# Patient Record
Sex: Male | Born: 1940 | Race: White | Hispanic: No | Marital: Married | Smoking: Former smoker
Health system: Southern US, Community
[De-identification: ages and names within clinical notes are randomized; demographics above are authoritative.]

## PROBLEM LIST (undated history)

## (undated) DIAGNOSIS — J449 Chronic obstructive pulmonary disease, unspecified: Secondary | ICD-10-CM

## (undated) DIAGNOSIS — I10 Essential (primary) hypertension: Secondary | ICD-10-CM

---

## 2005-05-23 ENCOUNTER — Emergency Department: Payer: Self-pay | Admitting: Emergency Medicine

## 2006-08-27 ENCOUNTER — Emergency Department: Payer: Self-pay | Admitting: Emergency Medicine

## 2007-07-05 ENCOUNTER — Emergency Department: Payer: Self-pay | Admitting: Emergency Medicine

## 2007-07-05 ENCOUNTER — Other Ambulatory Visit: Payer: Self-pay

## 2008-02-22 ENCOUNTER — Emergency Department: Payer: Self-pay | Admitting: Emergency Medicine

## 2008-02-22 ENCOUNTER — Other Ambulatory Visit: Payer: Self-pay

## 2010-06-09 ENCOUNTER — Observation Stay: Payer: Self-pay | Admitting: *Deleted

## 2016-02-18 ENCOUNTER — Emergency Department: Payer: Self-pay

## 2016-02-18 ENCOUNTER — Emergency Department
Admission: EM | Admit: 2016-02-18 | Discharge: 2016-02-19 | Disposition: A | Discharge status: Home / Self Care | Payer: Self-pay | Attending: Emergency Medicine | Admitting: Emergency Medicine

## 2016-02-18 ENCOUNTER — Encounter: Payer: Self-pay | Admitting: Emergency Medicine

## 2016-02-18 DIAGNOSIS — I10 Essential (primary) hypertension: Secondary | ICD-10-CM | POA: Insufficient documentation

## 2016-02-18 DIAGNOSIS — R0602 Shortness of breath: Secondary | ICD-10-CM | POA: Insufficient documentation

## 2016-02-18 DIAGNOSIS — R112 Nausea with vomiting, unspecified: Secondary | ICD-10-CM

## 2016-02-18 DIAGNOSIS — Z87891 Personal history of nicotine dependence: Secondary | ICD-10-CM | POA: Insufficient documentation

## 2016-02-18 DIAGNOSIS — J441 Chronic obstructive pulmonary disease with (acute) exacerbation: Secondary | ICD-10-CM

## 2016-02-18 DIAGNOSIS — R05 Cough: Secondary | ICD-10-CM

## 2016-02-18 DIAGNOSIS — R1084 Generalized abdominal pain: Secondary | ICD-10-CM

## 2016-02-18 DIAGNOSIS — R058 Other specified cough: Secondary | ICD-10-CM

## 2016-02-18 HISTORY — DX: Chronic obstructive pulmonary disease, unspecified: J44.9

## 2016-02-18 HISTORY — DX: Essential (primary) hypertension: I10

## 2016-02-18 LAB — MAGNESIUM: MAGNESIUM: 1.8 mg/dL (ref 1.7–2.4)

## 2016-02-18 LAB — COMPREHENSIVE METABOLIC PANEL
ALT: 24 U/L (ref 17–63)
ANION GAP: 10 (ref 5–15)
AST: 32 U/L (ref 15–41)
Albumin: 4.6 g/dL (ref 3.5–5.0)
Alkaline Phosphatase: 80 U/L (ref 38–126)
BUN: 24 mg/dL — ABNORMAL HIGH (ref 6–20)
CHLORIDE: 102 mmol/L (ref 101–111)
CO2: 28 mmol/L (ref 22–32)
CREATININE: 1.31 mg/dL — AB (ref 0.61–1.24)
Calcium: 9.5 mg/dL (ref 8.9–10.3)
GFR calc non Af Amer: 52 mL/min — ABNORMAL LOW (ref >60)
Glucose, Bld: 128 mg/dL — ABNORMAL HIGH (ref 65–99)
Potassium: 4.3 mmol/L (ref 3.5–5.1)
SODIUM: 140 mmol/L (ref 135–145)
Total Bilirubin: 0.8 mg/dL (ref 0.3–1.2)
Total Protein: 7.4 g/dL (ref 6.5–8.1)

## 2016-02-18 LAB — CBC WITH DIFFERENTIAL/PLATELET
BASOS PCT: 0 %
Basophils Absolute: 0 10*3/uL (ref 0–0.1)
EOS ABS: 0 10*3/uL (ref 0–0.7)
EOS PCT: 0 %
HCT: 43 % (ref 40.0–52.0)
Hemoglobin: 14.9 g/dL (ref 13.0–18.0)
LYMPHS ABS: 0.4 10*3/uL — AB (ref 1.0–3.6)
Lymphocytes Relative: 5 %
MCH: 30.4 pg (ref 26.0–34.0)
MCHC: 34.6 g/dL (ref 32.0–36.0)
MCV: 87.7 fL (ref 80.0–100.0)
Monocytes Absolute: 0 10*3/uL — ABNORMAL LOW (ref 0.2–1.0)
Monocytes Relative: 0 %
Neutro Abs: 8 10*3/uL — ABNORMAL HIGH (ref 1.4–6.5)
Neutrophils Relative %: 95 %
PLATELETS: 223 10*3/uL (ref 150–440)
RBC: 4.9 MIL/uL (ref 4.40–5.90)
RDW: 13.9 % (ref 11.5–14.5)
WBC: 8.5 10*3/uL (ref 3.8–10.6)

## 2016-02-18 LAB — TROPONIN I

## 2016-02-18 LAB — BRAIN NATRIURETIC PEPTIDE: B Natriuretic Peptide: 12 pg/mL (ref 0.0–100.0)

## 2016-02-18 LAB — LIPASE, BLOOD: Lipase: 33 U/L (ref 11–51)

## 2016-02-18 MED ORDER — ONDANSETRON HCL 4 MG/2ML IJ SOLN
4.0000 mg | INTRAMUSCULAR | Status: AC
  Administered 2016-02-18: 4 mg via INTRAVENOUS
  Filled 2016-02-18: qty 2

## 2016-02-18 MED ORDER — IPRATROPIUM-ALBUTEROL 0.5-2.5 (3) MG/3ML IN SOLN
3.0000 mL | Freq: Once | RESPIRATORY_TRACT | Status: DC
  Filled 2016-02-18: qty 3

## 2016-02-18 NOTE — ED Provider Notes (Signed)
[facility] Emergency Department Provider Note  ____________________________________________  Time seen: Approximately 11:02 PM  I have reviewed the triage vital signs and the nursing notes.   HISTORY  Chief Complaint Cough and Emesis    HPI Ross Montgomery is a 75 y.o. male who reports a medical history of COPD without a home oxygen requirement and hypertension who presents by EMS for evaluation of a severe cough productive of clear sputum, shortness of breath, and now vomiting.  He says that he was in his normal state of health yesterday but all day today he has been coughing heavily.  He gets into spasms of coughing and is producing a large amount of clear sputum.  By the evening he was coughing enough that he started vomiting as well and came in by EMS with an emesis bag with [place] in the bottom of it but no evidence of hematemesis.  He says that the muscles of his stomach hurt but he does not have abdominal pain per se.  He denies fever/chills, chest pain, dysuria.  He ascribes the cough is severe and the nausea is severe and nothing is making either one of them better.  The paramedics report that when the first responder arrived he was satting 93% on room air.  He was wheezing and was given about half of a DuoNeb prior to arrival to the emergency department.  The wheezing has since resolved.  Currently he denies rest of breath, just the persistent coughing although that seems a little bit better at this time.  He is sitting up and leaning forward but not in a tripod position for breathing, rather because he seems very nauseated and is holding his emesis bag.   Past Medical History  Diagnosis Date  . COPD (chronic obstructive pulmonary disease) (HCC)   . Hypertension     There are no active problems to display for this patient.   History reviewed. No pertinent past surgical history.  Current Outpatient Rx  Name  Route  Sig  Dispense  Refill  . albuterol  (PROVENTIL HFA;VENTOLIN HFA) 108 (90 Base) MCG/ACT inhaler      Inhale 4-6 puffs by mouth every 4 hours as needed for wheezing, cough, and/or shortness of breath   1 Inhaler   1   . ondansetron (ZOFRAN) 4 MG tablet      Take 1-2 tabs by mouth every 8 hours as needed for nausea/vomiting   30 tablet   0   . predniSONE (DELTASONE) 20 MG tablet   Oral   Take 2 tablets (40 mg total) by mouth daily.   10 tablet   0     Allergies Review of patient's allergies indicates no known allergies.  History reviewed. No pertinent family history.  Social History Social History  Substance Use Topics  . Smoking status: Former Smoker    Types: Cigarettes  . Smokeless tobacco: None  . Alcohol Use: No    Review of Systems Constitutional: No fever/chills Eyes: No visual changes. ENT: No sore throat. Cardiovascular: Denies chest pain. Respiratory: +shortness of breath and severe productive cough Gastrointestinal: Abdominal pain with multiple episodes of emesis, possibly posttussive Genitourinary: Negative for dysuria. Musculoskeletal: Negative for back pain. Skin: Negative for rash. Neurological: Negative for headaches, focal weakness or numbness.  10-point ROS otherwise negative.  ____________________________________________   PHYSICAL EXAM:  ED Triage Vitals  Enc Vitals Group     BP 02/18/16 2304 152/64 mmHg     Pulse Rate 02/18/16 2304 110  Resp --      Temp --      Temp src --      SpO2 02/18/16 2304 95 %     Weight 02/18/16 2304 157 lb 3.2 oz (71.305 kg)     Height 02/18/16 2304 5\' 8"  (1.727 m)     Head Cir --      Peak Flow --      Pain Score --      Pain Loc --      Pain Edu? --      Excl. in GC? --     Constitutional: Alert and oriented. Ill-appearing but non-toxic Eyes: Conjunctivae are normal. PERRL. EOMI. Head: Atraumatic. Nose: No congestion/rhinnorhea. Mouth/Throat: Mucous membranes are moist.  Oropharynx non-erythematous. Neck: No stridor.  No  meningeal signs.   Cardiovascular: Tachycardia, regular rhythm. Good peripheral circulation. Grossly normal heart sounds.   Respiratory: Normal respiratory effort.  No retractions. Lungs CTAB. Gastrointestinal: Soft and nontender. No distention.  Musculoskeletal: No lower extremity tenderness nor edema. No gross deformities of extremities. Neurologic:  Normal speech and language. No gross focal neurologic deficits are appreciated.  Skin:  Skin is warm, dry and intact. No rash noted. Psychiatric: Mood and affect are normal. Speech and behavior are normal.  ____________________________________________   LABS (all labs ordered are listed, but only abnormal results are displayed)  Labs Reviewed  CBC WITH DIFFERENTIAL/PLATELET - Abnormal; Notable for the following:    Neutro Abs 8.0 (*)    Lymphs Abs 0.4 (*)    Monocytes Absolute 0.0 (*)    All other components within normal limits  COMPREHENSIVE METABOLIC PANEL - Abnormal; Notable for the following:    Glucose, Bld 128 (*)    BUN 24 (*)    Creatinine, Ser 1.31 (*)    GFR calc non Af Amer 52 (*)    All other components within normal limits  LIPASE, BLOOD  TROPONIN I  MAGNESIUM  BRAIN NATRIURETIC PEPTIDE   ____________________________________________  EKG  ED ECG REPORT I, Worley Radermacher, the attending physician, personally viewed and interpreted this ECG.  Date: 02/19/2016 EKG Time: 01:11 Rate: 95 Rhythm: normal sinus rhythm QRS Axis: normal Intervals: normal ST/T Wave abnormalities: normal Conduction Disturbances: none Narrative Interpretation: unremarkable  ____________________________________________  RADIOLOGY   Ct Abdomen Pelvis W Contrast  02/19/2016  CLINICAL DATA:  Abdominal pain, vomiting, and cough. EXAM: CT ABDOMEN AND PELVIS WITH CONTRAST TECHNIQUE: Multidetector CT imaging of the abdomen and pelvis was performed using the standard protocol following bolus administration of intravenous contrast. CONTRAST:   100mL ISOVUE-300 IOPAMIDOL (ISOVUE-300) INJECTION 61% COMPARISON:  None. FINDINGS: The lung bases are clear. Small esophageal hiatal hernia. Fluid in the lower esophagus may indicate reflux or dysmotility. Mild diffuse fatty infiltration of the liver. Large stone in the gallbladder. No gallbladder wall thickening. No bile duct dilatation. Fatty infiltration of the pancreas. The spleen, adrenal glands, kidneys, inferior vena cava, and retroperitoneal lymph nodes are unremarkable. Diffuse calcification of the abdominal aorta without aneurysm. Stomach, small bowel, and colon are not abnormally distended. Scattered stool in the colon. No free air or free fluid in the abdomen. Small umbilical hernia containing fat. Pelvis: The appendix is normal. Prostate gland is enlarged, measuring 4.1 cm diameter. Bladder wall is not thickened. Small bilateral inguinal hernias containing fat. No free or loculated pelvic fluid collections. Diverticulosis of the sigmoid colon without evidence of diverticulitis. There is a focal inflamed fat lobule adjacent to the distal descending colon consistent with epiploic appendagitis. Degenerative changes  in the spine. No destructive bone lesions. Soft tissue nodule in the subcutaneous fat over the posterior lumbar spine consistent with sebaceous cyst. IMPRESSION: Small esophageal hiatal hernia with fluid in the lower esophagus suggesting reflux or dysmotility. No bowel obstruction or inflammation. Focal epiploic appendagitis in the left lower quadrant. Prostate gland is enlarged. Fatty infiltration of the liver. Small fat containing hernias at the umbilicus and both inguinal regions. Cholelithiasis. Aortic atherosclerosis. Electronically Signed   By: Burman Nieves M.D.   On: 02/19/2016 02:00   Dg Chest Portable 1 View  02/18/2016  CLINICAL DATA:  Shortness of breath, cough, and vomiting. EXAM: PORTABLE CHEST 1 VIEW COMPARISON:  06/09/2010 FINDINGS: Emphysematous changes in the lungs. No  focal airspace disease or consolidation. No blunting of costophrenic angles. No pneumothorax. Normal heart size and pulmonary vascularity. IMPRESSION: No active disease.  Emphysematous changes in the lungs. Electronically Signed   By: Burman Nieves M.D.   On: 02/18/2016 23:29    ____________________________________________   PROCEDURES  Procedure(s) performed:   Procedures   ____________________________________________   INITIAL IMPRESSION / ASSESSMENT AND PLAN / ED COURSE  Pertinent labs & imaging results that were available during my care of the patient were reviewed by me and considered in my medical decision making (see chart for details).  Currently the patient's lungs sound clear and they are open.  I will give him another DuoNeb given his history of COPD and the persistent coughing and it may also help with bronchospasm.  I am giving Zofran for the nausea and vomiting.  I will wait for IV fluids until we see the chest x-ray because of the possibility he may have some pulmonary edema which is the sputum he is producing, but I will reassess carefully once his lab results and imaging come back.  ----------------------------------------- 1:18 AM on 02/19/2016 -----------------------------------------  Labs and chest x-ray are unremarkable but the patient continues to vomit in spite of 2 rounds of Zofran 4 mg IV.  I will give Haldol 1 mg IV for the intractable vomiting.  He is also continued to complain of some abdominal pain although his main issue is the vomiting.  I will evaluate with a CT of the abdomen and pelvis with IV contrast only as he cannot currently tolerate by mouth contrast.   ----------------------------------------- 3:21 AM on 02/19/2016 -----------------------------------------  The patient is now resting comfortably.  He awoke to light touch and light voice.  He states that his pain has resolved as has his nausea.  I will watch him for a little bit longer to  make sure the nausea does not return.  ----------------------------------------- 4:16 AM on 02/19/2016 -----------------------------------------  Patient continues to rest comfortably, has no complaints at this time.  Overall reassuring workup, suspect mild COPD exacerbation led to posttussive emesis that became uncontrollable, but eventually resolved with medications.  Treating for COPD exacerbation and nausea/vomiting with Rx.    I gave my usual and customary return precautions.      ____________________________________________  FINAL CLINICAL IMPRESSION(S) / ED DIAGNOSES  Final diagnoses:  Non-intractable vomiting with nausea, vomiting of unspecified type  Generalized abdominal pain  Cough productive of clear sputum  COPD with acute exacerbation (HCC)     MEDICATIONS GIVEN DURING THIS VISIT:  Medications  ipratropium-albuterol (DUONEB) 0.5-2.5 (3) MG/3ML nebulizer solution 3 mL (0 mLs Nebulization Hold 02/18/16 2347)  ondansetron (ZOFRAN) injection 4 mg (4 mg Intravenous Given 02/18/16 2347)  ondansetron (ZOFRAN) injection 4 mg (4 mg Intravenous Given 02/19/16 0042)  morphine 4 MG/ML injection 4 mg (4 mg Intravenous Given 02/19/16 0042)  haloperidol lactate (HALDOL) injection 1 mg (1 mg Intravenous Given 02/19/16 0128)  iopamidol (ISOVUE-300) 61 % injection 100 mL (100 mLs Intravenous Contrast Given 02/19/16 0141)     NEW OUTPATIENT MEDICATIONS STARTED DURING THIS VISIT:  New Prescriptions   ALBUTEROL (PROVENTIL HFA;VENTOLIN HFA) 108 (90 BASE) MCG/ACT INHALER    Inhale 4-6 puffs by mouth every 4 hours as needed for wheezing, cough, and/or shortness of breath   ONDANSETRON (ZOFRAN) 4 MG TABLET    Take 1-2 tabs by mouth every 8 hours as needed for nausea/vomiting   PREDNISONE (DELTASONE) 20 MG TABLET    Take 2 tablets (40 mg total) by mouth daily.      Note:  This document was prepared using Dragon voice recognition software and may include unintentional dictation  errors.   Loleta Rose, MD 02/19/16 [phone]

## 2016-02-18 NOTE — ED Notes (Signed)
Pt to ED via [facility] c/o cough. Per EMS pt c/o cough w/ SOB sating at 90% on RA, EMS reports administering 1/2 duoneb. EMS also reports cough induced vomiting. Pt present alert and oriented, presently coughing and vomiting.

## 2016-02-19 ENCOUNTER — Emergency Department: Payer: Self-pay

## 2016-02-19 MED ORDER — IOPAMIDOL (ISOVUE-300) INJECTION 61%
100.0000 mL | Freq: Once | INTRAVENOUS | Status: AC | PRN
  Administered 2016-02-19: 100 mL via INTRAVENOUS

## 2016-02-19 MED ORDER — ALBUTEROL SULFATE HFA 108 (90 BASE) MCG/ACT IN AERS: INHALATION_SPRAY | RESPIRATORY_TRACT | Status: AC

## 2016-02-19 MED ORDER — MORPHINE SULFATE (PF) 4 MG/ML IV SOLN
4.0000 mg | Freq: Once | INTRAVENOUS | Status: AC
  Administered 2016-02-19: 4 mg via INTRAVENOUS

## 2016-02-19 MED ORDER — ONDANSETRON HCL 4 MG PO TABS: ORAL_TABLET | ORAL | Status: DC

## 2016-02-19 MED ORDER — ONDANSETRON HCL 4 MG/2ML IJ SOLN
4.0000 mg | INTRAMUSCULAR | Status: AC
  Administered 2016-02-19: 4 mg via INTRAVENOUS
  Filled 2016-02-19: qty 2

## 2016-02-19 MED ORDER — MORPHINE SULFATE (PF) 4 MG/ML IV SOLN
INTRAVENOUS | Status: AC
  Administered 2016-02-19: 4 mg via INTRAVENOUS
  Filled 2016-02-19: qty 1

## 2016-02-19 MED ORDER — HALOPERIDOL LACTATE 5 MG/ML IJ SOLN
1.0000 mg | Freq: Once | INTRAMUSCULAR | Status: AC
  Administered 2016-02-19: 1 mg via INTRAVENOUS
  Filled 2016-02-19: qty 1

## 2016-02-19 MED ORDER — PREDNISONE 20 MG PO TABS: 40.0000 mg | ORAL_TABLET | Freq: Every day | ORAL | Status: DC

## 2016-02-19 NOTE — ED Notes (Signed)
Pt returned from CT laying on stomach with no further vomiting and appears to be in less pain than since he arrived in er - O2 Sat 88% on room air - O2 2L started and O2 sat increased to 94% - encouraged pt to take inhalations through nose

## 2016-02-19 NOTE — ED Notes (Signed)
Pt refused to go to CT stating that he could not lay flat - Dr York CeriseForbach informed - pt then informed that the test was necessary and that we would take him after the Haldol had time to work

## 2016-02-19 NOTE — ED Notes (Signed)
After discussing with pt the importance of abd CT he has agreed to go to CT and attempt to lay flat for exam

## 2016-02-19 NOTE — ED Notes (Signed)
Unable to obtain ekg because pt refuses to lay back in bed and is laying forward bent over - he has had medication for nausea (no further vomiting noted just burping) - he has had medication for abd pain that he states has decreased

## 2016-02-19 NOTE — Discharge Instructions (Signed)
As we discussed, we think he may be having a mild COPD exacerbation which is causing your wheezing and cough.  This led to some vomiting which was difficult to get under control.  However, once it stopped, your symptoms improved.  Your chest x-ray, abdominal CT, and all your lab work were reassuring.  Please take the prescribed medications as recommended and follow up with your regular doctor at the next available opportunity.    Return to the emergency department if you develop new or worsening symptoms that concern you.   Nausea and Vomiting Nausea is a sick feeling that often comes before throwing up (vomiting). Vomiting is a reflex where stomach contents come out of your mouth. Vomiting can cause severe loss of body fluids (dehydration). Children and elderly adults can become dehydrated quickly, especially if they also have diarrhea. Nausea and vomiting are symptoms of a condition or disease. It is important to find the cause of your symptoms. CAUSES   Direct irritation of the stomach lining. This irritation can result from increased acid production (gastroesophageal reflux disease), infection, food poisoning, taking certain medicines (such as nonsteroidal anti-inflammatory drugs), alcohol use, or tobacco use.  Signals from the brain.These signals could be caused by a headache, heat exposure, an inner ear disturbance, increased pressure in the brain from injury, infection, a tumor, or a concussion, pain, emotional stimulus, or metabolic problems.  An obstruction in the gastrointestinal tract (bowel obstruction).  Illnesses such as diabetes, hepatitis, gallbladder problems, appendicitis, kidney problems, cancer, sepsis, atypical symptoms of a heart attack, or eating disorders.  Medical treatments such as chemotherapy and radiation.  Receiving medicine that makes you sleep (general anesthetic) during surgery. DIAGNOSIS Your caregiver may ask for tests to be done if the problems do not improve  after a few days. Tests may also be done if symptoms are severe or if the reason for the nausea and vomiting is not clear. Tests may include:  Urine tests.  Blood tests.  Stool tests.  Cultures (to look for evidence of infection).  X-rays or other imaging studies. Test results can help your caregiver make decisions about treatment or the need for additional tests. TREATMENT You need to stay well hydrated. Drink frequently but in small amounts.You may wish to drink water, sports drinks, clear broth, or eat frozen ice pops or gelatin dessert to help stay hydrated.When you eat, eating slowly may help prevent nausea.There are also some antinausea medicines that may help prevent nausea. HOME CARE INSTRUCTIONS   Take all medicine as directed by your caregiver.  If you do not have an appetite, do not force yourself to eat. However, you must continue to drink fluids.  If you have an appetite, eat a normal diet unless your caregiver tells you differently.  Eat a variety of complex carbohydrates (rice, wheat, potatoes, bread), lean meats, yogurt, fruits, and vegetables.  Avoid high-fat foods because they are more difficult to digest.  Drink enough water and fluids to keep your urine clear or pale yellow.  If you are dehydrated, ask your caregiver for specific rehydration instructions. Signs of dehydration may include:  Severe thirst.  Dry lips and mouth.  Dizziness.  Dark urine.  Decreasing urine frequency and amount.  Confusion.  Rapid breathing or pulse. SEEK IMMEDIATE MEDICAL CARE IF:   You have blood or brown flecks (like coffee grounds) in your vomit.  You have black or bloody stools.  You have a severe headache or stiff neck.  You are confused.  You have  severe abdominal pain.  You have chest pain or trouble breathing.  You do not urinate at least once every 8 hours.  You develop cold or clammy skin.  You continue to vomit for longer than 24 to 48  hours.  You have a fever. MAKE SURE YOU:   Understand these instructions.  Will watch your condition.  Will get help right away if you are not doing well or get worse.   This information is not intended to replace advice given to you by your health care provider. Make sure you discuss any questions you have with your health care provider.   Document Released: 07/26/2005 Document Revised: 10/18/2011 Document Reviewed: 12/23/2010 Elsevier Interactive Patient Education 2016 [employer] Inc[employer]  Chronic Obstructive Pulmonary Disease Exacerbation Chronic obstructive pulmonary disease (COPD) is a common lung problem. In COPD, the flow of air from the lungs is limited. COPD exacerbations are times that breathing gets worse and you need extra treatment. Without treatment they can be life threatening. If they happen often, your lungs can become more damaged. If your COPD gets worse, your doctor may treat you with:  Medicines.  Oxygen.  Different ways to clear your airway, such as using a mask. HOME CARE  Do not smoke.  Avoid tobacco smoke and other things that bother your lungs.  If given, take your antibiotic medicine as told. Finish the medicine even if you start to feel better.  Only take medicines as told by your doctor.  Drink enough fluids to keep your pee (urine) clear or pale yellow (unless your doctor has told you not to).  Use a cool mist machine (vaporizer).  If you use oxygen or a machine that turns liquid medicine into a mist (nebulizer), continue to use them as told.  Keep up with shots (vaccinations) as told by your doctor.  Exercise regularly.  Eat healthy foods.  Keep all doctor visits as told. GET HELP RIGHT AWAY IF:  You are very short of breath and it gets worse.  You have trouble talking.  You have bad chest pain.  You have blood in your spit (sputum).  You have a fever.  You keep throwing up (vomiting).  You feel weak, or you pass out  (faint).  You feel confused.  You keep getting worse. MAKE SURE YOU:  Understand these instructions.  Will watch your condition.  Will get help right away if you are not doing well or get worse.   This information is not intended to replace advice given to you by your health care provider. Make sure you discuss any questions you have with your health care provider.   Document Released: 07/15/2011 Document Revised: 08/16/2014 Document Reviewed: 03/30/2013 Elsevier Interactive Patient Education [employer]

## 2016-12-24 ENCOUNTER — Emergency Department
Admission: EM | Admit: 2016-12-24 | Discharge: 2016-12-24 | Disposition: A | Discharge status: Home / Self Care | Payer: Medicare Other | Attending: Emergency Medicine | Admitting: Emergency Medicine

## 2016-12-24 ENCOUNTER — Encounter: Payer: Self-pay | Admitting: Emergency Medicine

## 2016-12-24 DIAGNOSIS — I10 Essential (primary) hypertension: Secondary | ICD-10-CM | POA: Insufficient documentation

## 2016-12-24 DIAGNOSIS — L0291 Cutaneous abscess, unspecified: Secondary | ICD-10-CM

## 2016-12-24 DIAGNOSIS — L0201 Cutaneous abscess of face: Secondary | ICD-10-CM | POA: Insufficient documentation

## 2016-12-24 DIAGNOSIS — J449 Chronic obstructive pulmonary disease, unspecified: Secondary | ICD-10-CM | POA: Insufficient documentation

## 2016-12-24 DIAGNOSIS — Z87891 Personal history of nicotine dependence: Secondary | ICD-10-CM | POA: Insufficient documentation

## 2016-12-24 DIAGNOSIS — Z79899 Other long term (current) drug therapy: Secondary | ICD-10-CM | POA: Insufficient documentation

## 2016-12-24 MED ORDER — HYDROCODONE-ACETAMINOPHEN 5-325 MG PO TABS: ORAL_TABLET | ORAL | 0 refills | Status: DC

## 2016-12-24 MED ORDER — CEPHALEXIN 500 MG PO CAPS: 500.0000 mg | ORAL_CAPSULE | Freq: Three times a day (TID) | ORAL | 0 refills | Status: DC

## 2016-12-24 MED ORDER — IBUPROFEN 400 MG PO TABS
400.0000 mg | ORAL_TABLET | Freq: Once | ORAL | Status: AC
  Administered 2016-12-24: 400 mg via ORAL
  Filled 2016-12-24: qty 1

## 2016-12-24 NOTE — ED Provider Notes (Signed)
[facility] Emergency Department Provider Note ____________________________________________  Time seen: 1339  I have reviewed the triage vital signs and the nursing notes.  HISTORY  Chief Complaint  Abscess   HPI Ross InfanteRalph Zielinski is a 76 y.o. male is here complaining of a lesion to the right side of his face. Patient states that last evening he applied a heating pad to the area and had some pus drainage from it. Patient denies any fever or chills. He denies any injury to his face. He rates his pain is 4 out of 10.    Past Medical History:  Diagnosis Date  . COPD (chronic obstructive pulmonary disease) (HCC)   . Hypertension     There are no active problems to display for this patient.   History reviewed. No pertinent surgical history.  Prior to Admission medications   Medication Sig Start Date End Date Taking? Authorizing Provider  albuterol (PROVENTIL HFA;VENTOLIN HFA) 108 (90 Base) MCG/ACT inhaler Inhale 4-6 puffs by mouth every 4 hours as needed for wheezing, cough, and/or shortness of breath 02/19/16   Loleta RoseForbach, Cory, MD  cephALEXin (KEFLEX) 500 MG capsule Take 1 capsule (500 mg total) by mouth 3 (three) times daily. 12/24/16   Tommi RumpsSummers, Nanetta Wiegman L, PA-C  HYDROcodone-acetaminophen (NORCO/VICODIN) 5-325 MG tablet 1 tablet every 4-6 hours prn pain 12/24/16   Tommi RumpsSummers, Felina Tello L, PA-C  ondansetron [facility](ZOFRAN) 4 MG tablet Take 1-2 tabs by mouth every 8 hours as needed for nausea/vomiting 02/19/16   Loleta RoseForbach, Cory, MD  predniSONE (DELTASONE) 20 MG tablet Take 2 tablets (40 mg total) by mouth daily. 02/19/16   Loleta RoseForbach, Cory, MD    Allergies Patient has no known allergies.  No family history on file.  Social History Social History  Substance Use Topics  . Smoking status: Former Smoker    Types: Cigarettes  . Smokeless tobacco: Never Used  . Alcohol use No    Review of Systems  Constitutional: Negative for fever. Eyes: Negative for visual  changes. Cardiovascular: Negative for chest pain. Respiratory: Negative for shortness of breath. Gastrointestinal: Negative for vomiting Skin: Positive for facial lesion Neurological: Negative for headaches, focal weakness or numbness. ____________________________________________  PHYSICAL EXAM:  VITAL SIGNS: ED Triage Vitals [12/24/16 1259]  Enc Vitals Group     BP (!) 182/88     Pulse Rate 87     Resp 16     Temp 98.1 F (36.7 C)     Temp Source Oral     SpO2 94 %     Weight 170 lb (77.1 kg)     Height 5\' 8"  (1.727 m)     Head Circumference      Peak Flow      Pain Score 4     Pain Loc      Pain Edu?      Excl. in GC?     Constitutional: Alert and oriented. Well appearing and in no distress. Head: Normocephalic and atraumatic. Eyes: Conjunctivae are normal.  Nose: No congestion/rhinorrhea/epistaxis. Mouth/Throat: Mucous membranes are moist. Neck: Supple.  Hematological/Lymphatic/Immunological: No cervical lymphadenopathy. Cardiovascular: Normal rate, regular rhythm. Normal distal pulses. Respiratory: Normal respiratory effort. No wheezes/rales/rhonchi. Musculoskeletal: Nontender with normal range of motion in all extremities.  Neurologic:  Normal speech and language. No gross focal neurologic deficits are appreciated. Skin:  Skin is warm, dry and intact. To the right lateral facial area there is a small erythematous open papule with a 1 cm extending cellulitis around this area. There is no localized infection  or fluctuant area. Psychiatric: Mood and affect are normal. Patient exhibits appropriate insight and judgment.  INITIAL IMPRESSION / ASSESSMENT AND PLAN / ED COURSE  Patient will continue using warm compresses he was encouraged to use a moist warm washcloth rather than a heating pad to his face. He is also started on Keflex 500 mg 3 times a day and Norco if needed for severe pain. He will follow up with his PCP or return to the emergency room for any severe  worsening of his symptoms over the weekend.    ____________________________________________  FINAL CLINICAL IMPRESSION(S) / ED DIAGNOSES  Final diagnoses:  Abscess     Tommi Rumps, PA-C 12/24/16 1653    Pershing Proud Myra Rude, MD 12/26/16 2330

## 2016-12-24 NOTE — ED Triage Notes (Signed)
Pt to ED with c/o "spot on the side of the face". Pt states that area came up yesterday, he applied heating pad and Vaseline. Pt states that he got some pus out of it. Pt in NAD at this time.

## 2016-12-24 NOTE — Discharge Instructions (Signed)
Plan taking antibiotics as directed. Norco if needed for severe pain 1 every 4-6 hours if needed. Do not drive taking this medication. Use warm moist compresses or washcloth to the right side of your face frequently. Return to the emergency room if any severe worsening of your symptoms or fever.

## 2017-01-14 ENCOUNTER — Emergency Department: Payer: Medicare Other

## 2017-01-14 ENCOUNTER — Encounter: Payer: Self-pay | Admitting: Emergency Medicine

## 2017-01-14 ENCOUNTER — Inpatient Hospital Stay
Admission: EM | Admit: 2017-01-14 | Discharge: 2017-01-17 | DRG: 603 | Disposition: A | Discharge status: Home / Self Care | Payer: Medicare Other | Attending: Internal Medicine | Admitting: Internal Medicine

## 2017-01-14 DIAGNOSIS — J441 Chronic obstructive pulmonary disease with (acute) exacerbation: Secondary | ICD-10-CM | POA: Diagnosis present

## 2017-01-14 DIAGNOSIS — Z87891 Personal history of nicotine dependence: Secondary | ICD-10-CM

## 2017-01-14 DIAGNOSIS — L0390 Cellulitis, unspecified: Secondary | ICD-10-CM | POA: Diagnosis present

## 2017-01-14 DIAGNOSIS — M71122 Other infective bursitis, left elbow: Secondary | ICD-10-CM | POA: Diagnosis present

## 2017-01-14 DIAGNOSIS — E872 Acidosis, unspecified: Secondary | ICD-10-CM

## 2017-01-14 DIAGNOSIS — Z91128 Patient's intentional underdosing of medication regimen for other reason: Secondary | ICD-10-CM

## 2017-01-14 DIAGNOSIS — Z8679 Personal history of other diseases of the circulatory system: Secondary | ICD-10-CM

## 2017-01-14 DIAGNOSIS — L03114 Cellulitis of left upper limb: Secondary | ICD-10-CM | POA: Diagnosis present

## 2017-01-14 DIAGNOSIS — Z79899 Other long term (current) drug therapy: Secondary | ICD-10-CM

## 2017-01-14 DIAGNOSIS — L0291 Cutaneous abscess, unspecified: Secondary | ICD-10-CM

## 2017-01-14 LAB — COMPREHENSIVE METABOLIC PANEL
ALT: 20 U/L (ref 17–63)
AST: 23 U/L (ref 15–41)
Albumin: 4.2 g/dL (ref 3.5–5.0)
Alkaline Phosphatase: 72 U/L (ref 38–126)
Anion gap: 11 (ref 5–15)
BUN: 19 mg/dL (ref 6–20)
CHLORIDE: 102 mmol/L (ref 101–111)
CO2: 23 mmol/L (ref 22–32)
CREATININE: 0.95 mg/dL (ref 0.61–1.24)
Calcium: 9.7 mg/dL (ref 8.9–10.3)
Glucose, Bld: 122 mg/dL — ABNORMAL HIGH (ref 65–99)
POTASSIUM: 4.2 mmol/L (ref 3.5–5.1)
Sodium: 136 mmol/L (ref 135–145)
Total Bilirubin: 0.8 mg/dL (ref 0.3–1.2)
Total Protein: 7.7 g/dL (ref 6.5–8.1)

## 2017-01-14 LAB — CBC WITH DIFFERENTIAL/PLATELET
Basophils Absolute: 0 10*3/uL (ref 0–0.1)
Basophils Relative: 0 %
EOS ABS: 0.1 10*3/uL (ref 0–0.7)
Eosinophils Relative: 1 %
HCT: 40.5 % (ref 40.0–52.0)
Hemoglobin: 13.8 g/dL (ref 13.0–18.0)
LYMPHS ABS: 1.3 10*3/uL (ref 1.0–3.6)
Lymphocytes Relative: 11 %
MCH: 30 pg (ref 26.0–34.0)
MCHC: 34.1 g/dL (ref 32.0–36.0)
MCV: 88 fL (ref 80.0–100.0)
Monocytes Absolute: 0.9 10*3/uL (ref 0.2–1.0)
Monocytes Relative: 8 %
Neutro Abs: 9.2 10*3/uL — ABNORMAL HIGH (ref 1.4–6.5)
Neutrophils Relative %: 80 %
Platelets: 265 10*3/uL (ref 150–440)
RBC: 4.61 MIL/uL (ref 4.40–5.90)
RDW: 13.3 % (ref 11.5–14.5)
WBC: 11.5 10*3/uL — ABNORMAL HIGH (ref 3.8–10.6)

## 2017-01-14 LAB — TROPONIN I

## 2017-01-14 LAB — LACTIC ACID, PLASMA
LACTIC ACID, VENOUS: 2.2 mmol/L — AB (ref 0.5–1.9)
Lactic Acid, Venous: 3.1 mmol/L (ref 0.5–1.9)

## 2017-01-14 LAB — TSH: TSH: 1.643 u[IU]/mL (ref 0.350–4.500)

## 2017-01-14 MED ORDER — VANCOMYCIN HCL IN DEXTROSE 1-5 GM/200ML-% IV SOLN
1000.0000 mg | Freq: Once | INTRAVENOUS | Status: AC
  Administered 2017-01-14: 1000 mg via INTRAVENOUS
  Filled 2017-01-14: qty 200

## 2017-01-14 MED ORDER — SODIUM CHLORIDE 0.9 % IV SOLN
INTRAVENOUS | Status: DC
  Administered 2017-01-14 – 2017-01-15 (×3): via INTRAVENOUS

## 2017-01-14 MED ORDER — MORPHINE SULFATE (PF) 4 MG/ML IV SOLN
4.0000 mg | Freq: Once | INTRAVENOUS | Status: AC
  Administered 2017-01-14: 4 mg via INTRAVENOUS

## 2017-01-14 MED ORDER — ALBUTEROL SULFATE (2.5 MG/3ML) 0.083% IN NEBU
5.0000 mg | INHALATION_SOLUTION | Freq: Once | RESPIRATORY_TRACT | Status: AC
  Administered 2017-01-14: 5 mg via RESPIRATORY_TRACT
  Filled 2017-01-14: qty 6

## 2017-01-14 MED ORDER — IPRATROPIUM-ALBUTEROL 0.5-2.5 (3) MG/3ML IN SOLN
3.0000 mL | RESPIRATORY_TRACT | Status: AC
  Administered 2017-01-14: 3 mL via RESPIRATORY_TRACT
  Filled 2017-01-14 (×2): qty 3

## 2017-01-14 MED ORDER — SULFAMETHOXAZOLE-TRIMETHOPRIM 800-160 MG PO TABS: 2.0000 | ORAL_TABLET | Freq: Two times a day (BID) | ORAL | 0 refills | Status: DC

## 2017-01-14 MED ORDER — LIDOCAINE-EPINEPHRINE 2 %-1:100000 IJ SOLN
20.0000 mL | Freq: Once | INTRAMUSCULAR | Status: DC
  Filled 2017-01-14: qty 20

## 2017-01-14 MED ORDER — PREDNISONE 20 MG PO TABS
40.0000 mg | ORAL_TABLET | Freq: Every day | ORAL | Status: DC
  Administered 2017-01-15: 10:00:00 40 mg via ORAL
  Filled 2017-01-14: qty 2

## 2017-01-14 MED ORDER — DOCUSATE SODIUM 100 MG PO CAPS
100.0000 mg | ORAL_CAPSULE | Freq: Two times a day (BID) | ORAL | Status: DC
  Administered 2017-01-14 – 2017-01-17 (×4): 100 mg via ORAL
  Filled 2017-01-14 (×5): qty 1

## 2017-01-14 MED ORDER — MORPHINE SULFATE (PF) 4 MG/ML IV SOLN
INTRAVENOUS | Status: AC
  Filled 2017-01-14: qty 1

## 2017-01-14 MED ORDER — SODIUM CHLORIDE 0.9 % IV SOLN
Freq: Once | INTRAVENOUS | Status: AC
  Administered 2017-01-14: 18:00:00 via INTRAVENOUS

## 2017-01-14 MED ORDER — PIPERACILLIN-TAZOBACTAM 3.375 G IVPB 30 MIN
3.3750 g | Freq: Once | INTRAVENOUS | Status: AC
  Administered 2017-01-14: 3.375 g via INTRAVENOUS
  Filled 2017-01-14: qty 50

## 2017-01-14 MED ORDER — SODIUM CHLORIDE 0.9 % IV BOLUS (SEPSIS)
1000.0000 mL | Freq: Once | INTRAVENOUS | Status: AC
  Administered 2017-01-14: 1000 mL via INTRAVENOUS

## 2017-01-14 MED ORDER — TIOTROPIUM BROMIDE MONOHYDRATE 18 MCG IN CAPS
18.0000 ug | ORAL_CAPSULE | Freq: Every day | RESPIRATORY_TRACT | Status: DC
  Administered 2017-01-15 – 2017-01-17 (×3): 18 ug via RESPIRATORY_TRACT
  Filled 2017-01-14: qty 5

## 2017-01-14 MED ORDER — ALBUTEROL SULFATE (2.5 MG/3ML) 0.083% IN NEBU
2.5000 mg | INHALATION_SOLUTION | RESPIRATORY_TRACT | Status: DC
  Administered 2017-01-14 – 2017-01-15 (×3): 2.5 mg via RESPIRATORY_TRACT
  Filled 2017-01-14 (×3): qty 3

## 2017-01-14 MED ORDER — LORAZEPAM 2 MG/ML IJ SOLN
0.5000 mg | Freq: Once | INTRAMUSCULAR | Status: AC
  Administered 2017-01-14: 0.5 mg via INTRAVENOUS
  Filled 2017-01-14: qty 1

## 2017-01-14 MED ORDER — CEPHALEXIN 500 MG PO CAPS: 500.0000 mg | ORAL_CAPSULE | Freq: Four times a day (QID) | ORAL | 0 refills | Status: DC

## 2017-01-14 MED ORDER — ONDANSETRON HCL 4 MG/2ML IJ SOLN
4.0000 mg | Freq: Once | INTRAMUSCULAR | Status: AC
  Administered 2017-01-14: 4 mg via INTRAVENOUS
  Filled 2017-01-14: qty 2

## 2017-01-14 MED ORDER — ACETAMINOPHEN 325 MG PO TABS
650.0000 mg | ORAL_TABLET | Freq: Four times a day (QID) | ORAL | Status: DC | PRN
  Administered 2017-01-15 (×3): 650 mg via ORAL
  Filled 2017-01-14 (×3): qty 2

## 2017-01-14 MED ORDER — ACETAMINOPHEN 650 MG RE SUPP: 650.0000 mg | Freq: Four times a day (QID) | RECTAL | Status: DC | PRN

## 2017-01-14 MED ORDER — ONDANSETRON HCL 4 MG PO TABS
4.0000 mg | ORAL_TABLET | Freq: Once | ORAL | Status: AC
  Administered 2017-01-14: 4 mg via ORAL
  Filled 2017-01-14: qty 1

## 2017-01-14 MED ORDER — PNEUMOCOCCAL VAC POLYVALENT 25 MCG/0.5ML IJ INJ
0.5000 mL | INJECTION | INTRAMUSCULAR | Status: DC
  Filled 2017-01-14: qty 0.5

## 2017-01-14 MED ORDER — LIDOCAINE-EPINEPHRINE 2 %-1:100000 IJ SOLN
INTRAMUSCULAR | Status: AC
  Administered 2017-01-14: 15:00:00
  Filled 2017-01-14: qty 6.8

## 2017-01-14 MED ORDER — FENTANYL CITRATE (PF) 100 MCG/2ML IJ SOLN
50.0000 ug | Freq: Once | INTRAMUSCULAR | Status: AC
  Administered 2017-01-14: 50 ug via INTRAVENOUS
  Filled 2017-01-14: qty 2

## 2017-01-14 MED ORDER — ONDANSETRON HCL 4 MG/2ML IJ SOLN: 4.0000 mg | Freq: Four times a day (QID) | INTRAMUSCULAR | Status: DC | PRN

## 2017-01-14 MED ORDER — ONDANSETRON HCL 4 MG PO TABS: 4.0000 mg | ORAL_TABLET | Freq: Four times a day (QID) | ORAL | Status: DC | PRN

## 2017-01-14 MED ORDER — CEFAZOLIN SODIUM-DEXTROSE 1-4 GM/50ML-% IV SOLN
1.0000 g | Freq: Three times a day (TID) | INTRAVENOUS | Status: DC
  Administered 2017-01-14 – 2017-01-16 (×5): 1 g via INTRAVENOUS
  Filled 2017-01-14 (×7): qty 50

## 2017-01-14 MED ORDER — HYDROCODONE-ACETAMINOPHEN 5-325 MG PO TABS
1.0000 | ORAL_TABLET | Freq: Four times a day (QID) | ORAL | Status: DC | PRN
  Administered 2017-01-15: 01:00:00 1 via ORAL
  Filled 2017-01-14: qty 1

## 2017-01-14 MED ORDER — ENOXAPARIN SODIUM 40 MG/0.4ML ~~LOC~~ SOLN
40.0000 mg | SUBCUTANEOUS | Status: DC
  Administered 2017-01-14 – 2017-01-16 (×3): 40 mg via SUBCUTANEOUS
  Filled 2017-01-14 (×3): qty 0.4

## 2017-01-14 NOTE — ED Provider Notes (Signed)
Labs Reviewed  COMPREHENSIVE METABOLIC PANEL - Abnormal; Notable for the following:       Result Value   Glucose, Bld 122 (*)    All other components within normal limits  CBC WITH DIFFERENTIAL/PLATELET - Abnormal; Notable for the following:    WBC 11.5 (*)    Neutro Abs 9.2 (*)    All other components within normal limits  LACTIC ACID, PLASMA - Abnormal; Notable for the following:    Lactic Acid, Venous 2.2 (*)    All other components within normal limits  LACTIC ACID, PLASMA - Abnormal; Notable for the following:    Lactic Acid, Venous 3.1 (*)    All other components within normal limits  TROPONIN I   Patient's lactic acid is trending in the wrong direction. We will initiate fluid via sepsis protocols and ensure broad-spectrum antibiotics with admission.   Emily FilbertWilliams, Maribeth Jiles E, MD 01/14/17 [id number]

## 2017-01-14 NOTE — ED Notes (Signed)
Pt's wound marked with skin marker.

## 2017-01-14 NOTE — ED Triage Notes (Signed)
Patient presents to the ED with left arm swelling, redness, warmth and pain along with increased shortness of breath for the past few days.  Patient denies known injury to left arm.  Patient reports he has SOB at baseline but has been feeling worse since he ran out of his medications.  Patient reports he does not have a pcp because it is too expensive.  Patient denies chest pain.

## 2017-01-14 NOTE — ED Notes (Signed)
Christina RN called for report from this RN. In pt's room at time and unable to give report due to privacy. Will call back to give report.

## 2017-01-14 NOTE — ED Notes (Signed)
Called back to floor. Nurse busy in CC room. Christina RN will call this RN back.

## 2017-01-14 NOTE — ED Provider Notes (Addendum)
[facility] Emergency Department Provider Note  ____________________________________________   First MD Initiated Contact with Patient 01/14/17 1400     (approximate)  I have reviewed the triage vital signs and the nursing notes.   HISTORY  Chief Complaint Shortness of Breath and Arm Pain   HPI Ross Montgomery is a 76 y.o. male with a history of COPD as well as hypertension is present to the emergency department today with 1 day of left upper extremity swelling as well as worsening shortness of breath. He says that the pain and swelling started yesterday when he was cutting grass outside. He says that it is worsened today and spread up his arm.   Past Medical History:  Diagnosis Date  . COPD (chronic obstructive pulmonary disease) (HCC)   . Hypertension     There are no active problems to display for this patient.   History reviewed. No pertinent surgical history.  Prior to Admission medications   Medication Sig Start Date End Date Taking? Authorizing Provider  albuterol (PROVENTIL HFA;VENTOLIN HFA) 108 (90 Base) MCG/ACT inhaler Inhale 4-6 puffs by mouth every 4 hours as needed for wheezing, cough, and/or shortness of breath 02/19/16  Yes Loleta Rose, MD  cephALEXin (KEFLEX) 500 MG capsule Take 1 capsule (500 mg total) by mouth 3 (three) times daily. Patient not taking: Reported on 01/14/2017 12/24/16   Tommi Rumps, PA-C  HYDROcodone-acetaminophen (NORCO/VICODIN) 5-325 MG tablet 1 tablet every 4-6 hours prn pain Patient not taking: Reported on 01/14/2017 12/24/16   Tommi Rumps, PA-C  ondansetron [facility]) 4 MG tablet Take 1-2 tabs by mouth every 8 hours as needed for nausea/vomiting Patient not taking: Reported on 01/14/2017 02/19/16   Loleta Rose, MD  predniSONE (DELTASONE) 20 MG tablet Take 2 tablets (40 mg total) by mouth daily. Patient not taking: Reported on 01/14/2017 02/19/16   Loleta Rose, MD    Allergies Patient has no known  allergies.  No family history on file.  Social History Social History  Substance Use Topics  . Smoking status: Former Smoker    Types: Cigarettes  . Smokeless tobacco: Never Used  . Alcohol use No    Review of Systems  Constitutional: No fever/chills Eyes: No visual changes. ENT: No sore throat. Cardiovascular: Denies chest pain. Respiratory: as above Gastrointestinal: No abdominal pain.  No nausea, no vomiting.  No diarrhea.  No constipation. Genitourinary: Negative for dysuria. Musculoskeletal: Negative for back pain. Skin: as above Neurological: Negative for headaches, focal weakness or numbness.   ____________________________________________   PHYSICAL EXAM:  VITAL SIGNS: ED Triage Vitals [01/14/17 1338]  Enc Vitals Group     BP (!) 148/88     Pulse Rate 95     Resp 18     Temp 98.1 F (36.7 C)     Temp Source Oral     SpO2 96 %     Weight 165 lb (74.8 kg)     Height 5\' 9"  (1.753 m)     Head Circumference      Peak Flow      Pain Score 7     Pain Loc      Pain Edu?      Excl. in GC?     Constitutional: Alert and oriented. Well appearing and in no acute distress. Eyes: Conjunctivae are normal.  Head: Atraumatic. Nose: No congestion/rhinnorhea. Mouth/Throat: Mucous membranes are moist.  Neck: No stridor.   Cardiovascular: Normal rate, regular rhythm. Grossly normal heart sounds.  Good peripheral  circulation With equal, intact in bilateral radial pulses. Respiratory: Normal respiratory effort.  No retractions. Lungs CTAB. Gastrointestinal: Soft and nontender. No distention. No CVA tenderness. Musculoskeletal: No lower extremity tenderness nor edema.  No joint effusions.  Left upper extremity with a pustule to the lateral dorsal aspect of the proximal forearm. Around this pustule there is induration as well as tenderness to palpation. There is no crepitus. The swelling extends circumferentially around the forearm with streaking redness that goes all the  way up, crossing the elbow joint and almost to the shoulder.  Neurologic:  Normal speech and language. No gross focal neurologic deficits are appreciated. Skin:  Skin is warm, dry and intact. No rash noted. Psychiatric: Mood and affect are normal. Speech and behavior are normal.  ____________________________________________   LABS (all labs ordered are listed, but only abnormal results are displayed)  Labs Reviewed  COMPREHENSIVE METABOLIC PANEL - Abnormal; Notable for the following:       Result Value   Glucose, Bld 122 (*)    All other components within normal limits  CBC WITH DIFFERENTIAL/PLATELET - Abnormal; Notable for the following:    WBC 11.5 (*)    Neutro Abs 9.2 (*)    All other components within normal limits  LACTIC ACID, PLASMA - Abnormal; Notable for the following:    Lactic Acid, Venous 2.2 (*)    All other components within normal limits  TROPONIN I  LACTIC ACID, PLASMA   ____________________________________________  EKG  ED ECG REPORT I, Arelia Longest, the attending physician, personally viewed and interpreted this ECG.   Date: 01/14/2017  EKG Time: 1335  Rate: 94  Rhythm: normal sinus rhythm  Axis: normal  Intervals:none  ST&T Change: No ST segment elevation or depression. No abnormal T-wave inversion.  ____________________________________________  RADIOLOGY  No acute finding on the chest x-ray. ____________________________________________   PROCEDURES  Procedure(s) performed:  INCISION AND DRAINAGE Performed by: Arelia Longest Consent: Verbal consent obtained. Risks and benefits: risks, benefits and alternatives were discussed Type: abscess  Body area: left forearm  Anesthesia: local infiltration  Incision was made with a scalpel.  Local anesthetic: lidocaine 2% with epinephrine  Anesthetic total: 2 ml  Drainage: purulent  Drainage amount: minimal  Packing material: none  Patient tolerance: Patient tolerated the  procedure well with no immediate complications.     Procedures  Critical Care performed:   ____________________________________________   INITIAL IMPRESSION / ASSESSMENT AND PLAN / ED COURSE  Pertinent labs & imaging results that were available during my care of the patient were reviewed by me and considered in my medical decision making (see chart for details).  ----------------------------------------- 3:38 PM on 01/14/2017 -----------------------------------------  Patient with mildly elevated lactic acidosis. I discussed admission with him and he is adamant about not being admitted to the hospital because he says that he cannot afford the hospital bill. He is aware that this is a fairly large area of skin infection. It is possible without adequate treatment that this could result in worsening of the infection as well as loss of the limb and death. However, the patient says that he would rather try oral antibiotics at home and return if there are any worsening or concerning symptoms. He has not clearly intoxicated and has capacity to make decisions. He has good insight into his medical problems at this time. Unclear cause of the patient's shortness of breath. I do not make sure that there is no evidence of DVT in left upper  extremity. His lungs are clear and he has reassuring vitals. Possibly secondary to cellulitis.  Pending repeat lactic acid as well as ultrasound venous Doppler of the left upper extremity. Signed out to Dr. Mayford KnifeWilliams.     ____________________________________________   FINAL CLINICAL IMPRESSION(S) / ED DIAGNOSES  Cellulitis.    NEW MEDICATIONS STARTED DURING THIS VISIT:  New Prescriptions   No medications on file     Note:  This document was prepared using Dragon voice recognition software and may include unintentional dictation errors.     Myrna BlazerSchaevitz, David Matthew, MD 01/14/17 1540    Schaevitz, Myra Rudeavid Matthew, MD 01/14/17 [phone]

## 2017-01-14 NOTE — Care Management Note (Signed)
Case Management Note  Patient Details  Name: Crist InfanteRalph Gagan MRN: [id number] Date of Birth: [birth year]  Subjective/Objective:   Spoke to the patient at bedside with wife in attendance. He only has Part A Medicare and says he cannot afford a medication plan. I have given him a medication management clinic application, and the wife says she will assist him in completing it. They have no other questions at this time.                 Action/Plan:   Expected Discharge Date:                  Expected Discharge Plan:     In-House Referral:     Discharge planning Services     Post Acute Care Choice:    Choice offered to:     DME Arranged:    DME Agency:     HH Arranged:    HH Agency:     Status of Service:     If discussed at [employer] Length of Stay Meetings, dates discussed:    Additional Comments:  Berna BueCheryl Mohmed Farver, RN 01/14/2017, 2:26 PM

## 2017-01-14 NOTE — ED Notes (Signed)
MD Schaevitz at bedside. 

## 2017-01-14 NOTE — ED Notes (Signed)
Patient transported to Ultrasound 

## 2017-01-14 NOTE — ED Notes (Signed)
Called floor for report x1. Was informed that nurse is getting report at this time and unable to get report from ED. Will attempt to call floor after appropriate interval of time.

## 2017-01-14 NOTE — H&P (Signed)
Ross Montgomery is an 76 y.o. male.   Chief Complaint: arm pain HPI: the patient with past medical history of COPD and hypertension presents to the emergency department complaining of left arm pain. The patient states that it began to hurt approximately 4 days ago. Since that time it has become swollen and red. He denies fever, nausea or vomiting. Vital signs were stable but the patient was found to have an increasing lactic acidin addition to increased work of breathing. The patient does not have any medications at home due to lack of insurance coverage. He required one breathing treatment in triage as well as albuterol in the emergency department. Due to his unmanaged medical problems and likely worsening cellulitis the emergency department staff asked the hospitalist service for admission.  Past Medical History:  Diagnosis Date  . COPD (chronic obstructive pulmonary disease) ([place])   . Hypertension     History reviewed. No pertinent surgical history. None  Family History  Problem Relation Age of Onset  . Heart disease Mother   . Heart disease Father   . Non-Hodgkin's lymphoma Sister   . Non-Hodgkin's lymphoma Brother    Social History:  reports that he has quit smoking. His smoking use included Cigarettes. He has never used smokeless tobacco. He reports that he does not drink alcohol. His drug history is not on file.  Allergies: No Known Allergies  Medications Prior to Admission  Medication Sig Dispense Refill  . albuterol (PROVENTIL HFA;VENTOLIN HFA) 108 (90 Base) MCG/ACT inhaler Inhale 4-6 puffs by mouth every 4 hours as needed for wheezing, cough, and/or shortness of breath 1 Inhaler 1  . HYDROcodone-acetaminophen (NORCO/VICODIN) 5-325 MG tablet 1 tablet every 4-6 hours prn pain (Patient not taking: Reported on 01/14/2017) 12 tablet 0  . ondansetron (ZOFRAN) 4 MG tablet Take 1-2 tabs by mouth every 8 hours as needed for nausea/vomiting (Patient not taking: Reported on 01/14/2017) 30 tablet  0  . predniSONE (DELTASONE) 20 MG tablet Take 2 tablets (40 mg total) by mouth daily. (Patient not taking: Reported on 01/14/2017) 10 tablet 0    Results for orders placed or performed during the hospital encounter of 01/14/17 (from the past 48 hour(s))  Comprehensive metabolic panel     Status: Abnormal   Collection Time: 01/14/17  1:40 PM  Result Value Ref Range   Sodium 136 135 - 145 mmol/L   Potassium 4.2 3.5 - 5.1 mmol/L   Chloride 102 101 - 111 mmol/L   CO2 23 22 - 32 mmol/L   Glucose, Bld 122 (H) 65 - 99 mg/dL   BUN 19 6 - 20 mg/dL   Creatinine, Ser 0.95 0.61 - 1.24 mg/dL   Calcium 9.7 8.9 - 10.3 mg/dL   Total Protein 7.7 6.5 - 8.1 g/dL   Albumin 4.2 3.5 - 5.0 g/dL   AST 23 15 - 41 U/L   ALT 20 17 - 63 U/L   Alkaline Phosphatase 72 38 - 126 U/L   Total Bilirubin 0.8 0.3 - 1.2 mg/dL   GFR calc non Af Amer >60 >60 mL/min   GFR calc Af Amer >60 >60 mL/min    Comment: (NOTE) The eGFR has been calculated using the CKD EPI equation. This calculation has not been validated in all clinical situations. eGFR's persistently <60 mL/min signify possible Chronic Kidney Disease.    Anion gap 11 5 - 15  CBC with Differential     Status: Abnormal   Collection Time: 01/14/17  1:40 PM  Result Value Ref  Range   WBC 11.5 (H) 3.8 - 10.6 K/uL   RBC 4.61 4.40 - 5.90 MIL/uL   Hemoglobin 13.8 13.0 - 18.0 g/dL   HCT 40.5 40.0 - 52.0 %   MCV 88.0 80.0 - 100.0 fL   MCH 30.0 26.0 - 34.0 pg   MCHC 34.1 32.0 - 36.0 g/dL   RDW 13.3 11.5 - 14.5 %   Platelets 265 150 - 440 K/uL   Neutrophils Relative % 80 %   Neutro Abs 9.2 (H) 1.4 - 6.5 K/uL   Lymphocytes Relative 11 %   Lymphs Abs 1.3 1.0 - 3.6 K/uL   Monocytes Relative 8 %   Monocytes Absolute 0.9 0.2 - 1.0 K/uL   Eosinophils Relative 1 %   Eosinophils Absolute 0.1 0 - 0.7 K/uL   Basophils Relative 0 %   Basophils Absolute 0.0 0 - 0.1 K/uL  Troponin I     Status: None   Collection Time: 01/14/17  1:40 PM  Result Value Ref Range    Troponin I <0.03 <0.03 ng/mL  Lactic acid, plasma     Status: Abnormal   Collection Time: 01/14/17  2:37 PM  Result Value Ref Range   Lactic Acid, Venous 2.2 (HH) 0.5 - 1.9 mmol/L    Comment: CRITICAL RESULT CALLED TO, READ BACK BY AND VERIFIED WITH KIM MAIN @ 1510 01/14/17 BY [facility]   Lactic acid, plasma     Status: Abnormal   Collection Time: 01/14/17  4:47 PM  Result Value Ref Range   Lactic Acid, Venous 3.1 (HH) 0.5 - 1.9 mmol/L    Comment: CRITICAL RESULT CALLED TO, READ BACK BY AND VERIFIED WITH KIM MAIN @ 1732 01/14/17 BY [facility]    Dg Chest 2 View  Result Date: 01/14/2017 CLINICAL DATA:  Shortness of breath.  Left upper extremity swelling EXAM: CHEST  2 VIEW COMPARISON:  February 18, 2016 FINDINGS: There is slight apparent scarring in the lingula. There is no edema or consolidation. Heart size and pulmonary vascularity are normal. No adenopathy. There is aortic atherosclerosis. No bone lesions. IMPRESSION: Mild lingular scarring. No edema or consolidation. There is aortic atherosclerosis. Electronically Signed   By: Lowella Grip III M.D.   On: 01/14/2017 14:19   US Venous Img Upper Uni Left  Result Date: 01/14/2017 CLINICAL DATA:  Swelling. EXAM: LEFT UPPER EXTREMITY VENOUS DOPPLER ULTRASOUND TECHNIQUE: Gray-scale sonography with graded compression, as well as color Doppler and duplex ultrasound were performed to evaluate the upper extremity deep venous system from the level of the subclavian vein and including the jugular, axillary, basilic, radial, ulnar and upper cephalic vein. Spectral Doppler was utilized to evaluate flow at rest and with distal augmentation maneuvers. COMPARISON:  None. FINDINGS: Contralateral Subclavian Vein: Respiratory phasicity is normal and symmetric with the symptomatic side. No evidence of thrombus. Normal compressibility. Internal Jugular Vein: No evidence of thrombus. Normal compressibility, respiratory phasicity and response to augmentation. Subclavian Vein: No  evidence of thrombus. Normal compressibility, respiratory phasicity and response to augmentation. Axillary Vein: No evidence of thrombus. Normal compressibility, respiratory phasicity and response to augmentation. Cephalic Vein: No evidence of thrombus. Normal compressibility, respiratory phasicity and response to augmentation. Basilic Vein: No evidence of thrombus. Normal compressibility, respiratory phasicity and response to augmentation. Brachial Veins: No evidence of thrombus. Normal compressibility, respiratory phasicity and response to augmentation. Radial Veins: No evidence of thrombus. Normal compressibility, respiratory phasicity and response to augmentation. Ulnar Veins: No evidence of thrombus. Normal compressibility, respiratory phasicity and response to augmentation. Venous  Reflux:  None visualized. Other Findings:  None visualized. IMPRESSION: No evidence of DVT within the left upper extremity. Electronically Signed   By: Dorise Bullion III M.D   On: 01/14/2017 16:37    Review of Systems  Constitutional: Negative for chills and fever.  HENT: Negative for sore throat and tinnitus.   Eyes: Negative for blurred vision and redness.  Respiratory: Positive for shortness of breath. Negative for cough.   Cardiovascular: Negative for chest pain, palpitations, orthopnea and PND.  Gastrointestinal: Negative for abdominal pain, diarrhea, nausea and vomiting.  Genitourinary: Negative for dysuria, frequency and urgency.  Musculoskeletal: Negative for joint pain and myalgias.  Skin: Negative for rash.       No lesions  Neurological: Negative for speech change, focal weakness and weakness.  Endo/Heme/Allergies: Does not bruise/bleed easily.       No temperature intolerance  Psychiatric/Behavioral: Negative for depression and suicidal ideas.    Blood pressure (!) 148/88, pulse 95, temperature 98.1 F (36.7 C), temperature source Oral, resp. rate 18, height 5' 9"  (1.753 m), weight 74.8 kg (165 lb),  SpO2 96 %. Physical Exam  Vitals reviewed. Constitutional: He is oriented to person, place, and time. He appears well-developed and well-nourished. No distress.  HENT:  Head: Normocephalic.  Mouth/Throat: Oropharynx is clear and moist.  Eyes: Conjunctivae and EOM are normal. Pupils are equal, round, and reactive to light. No scleral icterus.  Neck: Normal range of motion. Neck supple. No JVD present. No tracheal deviation present. No thyromegaly present.  Cardiovascular: Normal rate, regular rhythm and normal heart sounds.  Exam reveals no gallop and no friction rub.   No murmur heard. Respiratory: No respiratory distress. He has decreased breath sounds.  Increased effort  GI: Soft. Bowel sounds are normal. He exhibits no distension. There is no tenderness.  Genitourinary:  Genitourinary Comments: Deferred  Musculoskeletal: Normal range of motion. He exhibits no edema.  Lymphadenopathy:    He has no cervical adenopathy.  Neurological: He is alert and oriented to person, place, and time. No cranial nerve deficit.  Skin: Skin is warm and dry. No rash noted. No erythema.  Psychiatric: He has a normal mood and affect. His behavior is normal. Judgment and thought content normal.     Assessment/Plan This is a 76 year old male admitted for cellulitis. 1. Cellulitis: Half of left arm. Nonpurulent; cefazolin IV. He does not meet criteria for sepsis. 2. COPD: uncontrolled.the patient does not have cough or increased sputum production. Oxygen saturations are normal on room air but he was not moving a lot of air during first exam. He was breathing more comfortably after albuterol but still auto-PEEPs. I have started him on Spiriva.at some point in the past the patient was on daily prednisone which I will restart. We'll obtain case management consult for medication assistance. 3. DVT prophylaxis: Lovenox 4. GI prophylaxis: None The patient is a full code. Time spent on admission orders and patient  care approximately 45 minutes  Harrie Foreman, MD 01/14/2017, 6:59 PM

## 2017-01-15 MED ORDER — ALBUTEROL SULFATE (2.5 MG/3ML) 0.083% IN NEBU
2.5000 mg | INHALATION_SOLUTION | Freq: Two times a day (BID) | RESPIRATORY_TRACT | Status: DC
  Administered 2017-01-15 – 2017-01-17 (×2): 2.5 mg via RESPIRATORY_TRACT
  Filled 2017-01-15 (×3): qty 3

## 2017-01-15 NOTE — Plan of Care (Signed)
Problem: Education: Goal: Knowledge of [facility] Education information/materials will improve Outcome: Progressing Plan of care reviewed with pt with his verbal acknowledgment of understanding.   Problem: Safety: Goal: Ability to remain free from injury will improve Outcome: Progressing No new injury noted and pt reminded how to reach nursing staff if assistance is needed.   Problem: Pain Managment: Goal: General experience of comfort will improve Outcome: Progressing Tylenol successfully given to subside minimal pain from cellulitis in left arm.   Problem: Activity: Goal: Risk for activity intolerance will decrease Outcome: Progressing Pt ambulates well in room independently.  Problem: Skin Integrity: Goal: Skin integrity will improve Outcome: Progressing IV abx given during this shift. PT states he can already see a difference from the abx tx.

## 2017-01-15 NOTE — Progress Notes (Signed)
Patient ID: Ross Montgomery Sumlin, male   DOB: 1940/08/10, 76 y.o.   MRN: [id number]  [facility] PROGRESS NOTE  Ross Montgomery JYN:[id number] DOB: 1940/08/10 DOA: 01/14/2017 PCP: Patient, No Pcp Per  HPI/Subjective: Patient's left arm is red and swollen. Little bit of pain. He is able to bend and flex at the elbow. He stated it started as some elbow swelling.  Objective: Vitals:   01/14/17 2052 01/15/17 0515  BP: 134/61 132/60  Pulse: 93 85  Resp: 19 20  Temp:  98 F (36.7 C)    Filed Weights   01/14/17 1338 01/14/17 2052  Weight: 74.8 kg (165 lb) 78.1 kg (172 lb 4 oz)    ROS: Review of Systems  Constitutional: Negative for chills and fever.  Eyes: Negative for blurred vision.  Respiratory: Negative for cough and shortness of breath.   Cardiovascular: Negative for chest pain.  Gastrointestinal: Negative for abdominal pain, constipation, diarrhea, nausea and vomiting.  Genitourinary: Negative for dysuria.  Musculoskeletal: Negative for joint pain.  Neurological: Negative for dizziness and headaches.   Exam: Physical Exam  Constitutional: He is oriented to person, place, and time.  HENT:  Nose: No mucosal edema.  Mouth/Throat: No oropharyngeal exudate or posterior oropharyngeal edema.  Eyes: Conjunctivae, EOM and lids are normal. Pupils are equal, round, and reactive to light.  Neck: No JVD present. Carotid bruit is not present. No edema present. No thyroid mass and no thyromegaly present.  Cardiovascular: S1 normal and S2 normal.  Exam reveals no gallop.   No murmur heard. Pulses:      Dorsalis pedis pulses are 2+ on the right side, and 2+ on the left side.  Respiratory: No respiratory distress. He has no wheezes. He has no rhonchi. He has no rales.  GI: Soft. Bowel sounds are normal. There is no tenderness.  Musculoskeletal:       Right shoulder: He exhibits no swelling.       Right forearm: He exhibits tenderness, swelling and edema.  Lymphadenopathy:    He has no  cervical adenopathy.  Neurological: He is alert and oriented to person, place, and time. No cranial nerve deficit.  Skin: Skin is warm. Nails show no clubbing.  Left arm, elbow and forearm swollen and red  Psychiatric: He has a normal mood and affect.      Data Reviewed: Basic Metabolic Panel:  Recent Labs Lab 01/14/17 1340  NA 136  K 4.2  CL 102  CO2 23  GLUCOSE 122*  BUN 19  CREATININE 0.95  CALCIUM 9.7   Liver Function Tests:  Recent Labs Lab 01/14/17 1340  AST 23  ALT 20  ALKPHOS 72  BILITOT 0.8  PROT 7.7  ALBUMIN 4.2   CBC:  Recent Labs Lab 01/14/17 1340  WBC 11.5*  NEUTROABS 9.2*  HGB 13.8  HCT 40.5  MCV 88.0  PLT 265   Cardiac Enzymes:  Recent Labs Lab 01/14/17 1340  TROPONINI <0.03   BNP (last 3 results)  Recent Labs  02/18/16 2307  BNP 12.0      Studies: Dg Chest 2 View  Result Date: 01/14/2017 CLINICAL DATA:  Shortness of breath.  Left upper extremity swelling EXAM: CHEST  2 VIEW COMPARISON:  February 18, 2016 FINDINGS: There is slight apparent scarring in the lingula. There is no edema or consolidation. Heart size and pulmonary vascularity are normal. No adenopathy. There is aortic atherosclerosis. No bone lesions. IMPRESSION: Mild lingular scarring. No edema or consolidation. There is aortic atherosclerosis. Electronically Signed  By: Bretta Bang III M.D.   On: 01/14/2017 14:19   US Venous Img Upper Uni Left  Result Date: 01/14/2017 CLINICAL DATA:  Swelling. EXAM: LEFT UPPER EXTREMITY VENOUS DOPPLER ULTRASOUND TECHNIQUE: Gray-scale sonography with graded compression, as well as color Doppler and duplex ultrasound were performed to evaluate the upper extremity deep venous system from the level of the subclavian vein and including the jugular, axillary, basilic, radial, ulnar and upper cephalic vein. Spectral Doppler was utilized to evaluate flow at rest and with distal augmentation maneuvers. COMPARISON:  None. FINDINGS: Contralateral  Subclavian Vein: Respiratory phasicity is normal and symmetric with the symptomatic side. No evidence of thrombus. Normal compressibility. Internal Jugular Vein: No evidence of thrombus. Normal compressibility, respiratory phasicity and response to augmentation. Subclavian Vein: No evidence of thrombus. Normal compressibility, respiratory phasicity and response to augmentation. Axillary Vein: No evidence of thrombus. Normal compressibility, respiratory phasicity and response to augmentation. Cephalic Vein: No evidence of thrombus. Normal compressibility, respiratory phasicity and response to augmentation. Basilic Vein: No evidence of thrombus. Normal compressibility, respiratory phasicity and response to augmentation. Brachial Veins: No evidence of thrombus. Normal compressibility, respiratory phasicity and response to augmentation. Radial Veins: No evidence of thrombus. Normal compressibility, respiratory phasicity and response to augmentation. Ulnar Veins: No evidence of thrombus. Normal compressibility, respiratory phasicity and response to augmentation. Venous Reflux:  None visualized. Other Findings:  None visualized. IMPRESSION: No evidence of DVT within the left upper extremity. Electronically Signed   By: Gerome Sam III M.D   On: 01/14/2017 16:37    Scheduled Meds: . albuterol  2.5 mg Nebulization BID  . docusate sodium  100 mg Oral BID  . enoxaparin (LOVENOX) injection  40 mg Subcutaneous Q24H  . lidocaine-EPINEPHrine  20 mL Intradermal Once  . pneumococcal 23 valent vaccine  0.5 mL Intramuscular Tomorrow-1000  . predniSONE  40 mg Oral Daily  . tiotropium  18 mcg Inhalation Daily   Continuous Infusions: . sodium chloride 50 mL/hr at 01/15/17 0838  .  ceFAZolin (ANCEF) IV 1 g (01/15/17 1358)    Assessment/Plan:  1. Cellulitis and likely left elbow infected bursitis. Continue IV Ancef.  2. COPD exacerbation. Prednisone, nebulizer treatments and Spiriva 3. History of hypertension but  last blood pressure in normal range.  Code Status:     Code Status Orders        Start     Ordered   01/14/17 2026  Full code  Continuous     01/14/17 2025    Code Status History    Date Active Date Inactive Code Status Order ID Comments User Context   This patient has a current code status but no historical code status.     Family Communication: Spoke with wife on the phone Disposition Plan: Cellulitis and swelling will need to improve prior to disposition  Antibiotics:  Ancef  Time spent: 28 minutes  Alford Highland  [employer]

## 2017-01-16 ENCOUNTER — Inpatient Hospital Stay: Payer: Medicare Other

## 2017-01-16 LAB — HEMOGLOBIN A1C
Hgb A1c MFr Bld: 6.1 % — ABNORMAL HIGH (ref 4.8–5.6)
Mean Plasma Glucose: 128 mg/dL

## 2017-01-16 LAB — CBC
HCT: 35.6 % — ABNORMAL LOW (ref 40.0–52.0)
Hemoglobin: 12.1 g/dL — ABNORMAL LOW (ref 13.0–18.0)
MCH: 30.5 pg (ref 26.0–34.0)
MCHC: 33.9 g/dL (ref 32.0–36.0)
MCV: 90.1 fL (ref 80.0–100.0)
PLATELETS: 239 10*3/uL (ref 150–440)
RBC: 3.95 MIL/uL — ABNORMAL LOW (ref 4.40–5.90)
RDW: 14 % (ref 11.5–14.5)
WBC: 12.4 10*3/uL — ABNORMAL HIGH (ref 3.8–10.6)

## 2017-01-16 LAB — LACTIC ACID, PLASMA: Lactic Acid, Venous: 1.4 mmol/L (ref 0.5–1.9)

## 2017-01-16 MED ORDER — VANCOMYCIN HCL IN DEXTROSE 750-5 MG/150ML-% IV SOLN
750.0000 mg | Freq: Two times a day (BID) | INTRAVENOUS | Status: DC
  Administered 2017-01-16 – 2017-01-17 (×2): 750 mg via INTRAVENOUS
  Filled 2017-01-16 (×4): qty 150

## 2017-01-16 MED ORDER — ALPRAZOLAM 0.25 MG PO TABS: 0.2500 mg | ORAL_TABLET | Freq: Three times a day (TID) | ORAL | Status: DC | PRN

## 2017-01-16 MED ORDER — LORAZEPAM 2 MG/ML IJ SOLN
1.0000 mg | Freq: Four times a day (QID) | INTRAMUSCULAR | Status: DC | PRN
  Administered 2017-01-16: 19:00:00 1 mg via INTRAVENOUS
  Filled 2017-01-16: qty 1

## 2017-01-16 MED ORDER — PREDNISONE 20 MG PO TABS
20.0000 mg | ORAL_TABLET | Freq: Every day | ORAL | Status: DC
  Administered 2017-01-16 – 2017-01-17 (×2): 20 mg via ORAL
  Filled 2017-01-16 (×2): qty 1

## 2017-01-16 MED ORDER — VANCOMYCIN HCL IN DEXTROSE 1-5 GM/200ML-% IV SOLN
1000.0000 mg | Freq: Once | INTRAVENOUS | Status: AC
  Administered 2017-01-16: 13:00:00 1000 mg via INTRAVENOUS
  Filled 2017-01-16: qty 200

## 2017-01-16 MED ORDER — GADOBENATE DIMEGLUMINE 529 MG/ML IV SOLN
15.0000 mL | Freq: Once | INTRAVENOUS | Status: AC | PRN
  Administered 2017-01-16: 20:00:00 15 mL via INTRAVENOUS

## 2017-01-16 MED ORDER — SODIUM CHLORIDE 0.9 % IV SOLN
3.0000 g | Freq: Four times a day (QID) | INTRAVENOUS | Status: DC
  Administered 2017-01-16 – 2017-01-17 (×4): 3 g via INTRAVENOUS
  Filled 2017-01-16 (×9): qty 3

## 2017-01-16 NOTE — Progress Notes (Signed)
Pt called out to have me come and look at his hand and arm. He states that it is getting more swollen and he is concerned about it. His hand and arm do look slightly more swollen this AM. Dr. Renae GlossWieting notified, orders placed in epic by Dr. Renae GlossWieting.

## 2017-01-16 NOTE — Progress Notes (Signed)
Pharmacy Antibiotic Note  Ross InfanteRalph Montgomery is a 76 y.o. male admitted on 01/14/2017 with cellulitis with possible abscess.  Pharmacy has been consulted for Unasyn and vancomycin dosing.  Plan: 1. Unasyn 3 gm IV Q6H 2. Vancomycin 1000 mg IV x 1 followed in approximately 6 hours (stacked dosing) by vancomycin 750 mg IV Q12H, predicted trough 15 mcg/mL. Pharmacy will continue to follow and adjust as needed to maintain trough 15 to 20 mcg/mL.   Vd 49.5 L, Ke 0.06 hr-1, T1/2 11.5 hr  Height: 5\' 9"  (175.3 cm) Weight: 172 lb (78 kg) IBW/kg (Calculated) : 70.7  Temp (24hrs), Avg:98.1 F (36.7 C), Min:97.8 F (36.6 C), Max:98.2 F (36.8 C)   Recent Labs Lab 01/14/17 1340 01/14/17 1437 01/14/17 1647 01/16/17 0556  WBC 11.5*  --   --  12.4*  CREATININE 0.95  --   --   --   LATICACIDVEN  --  2.2* 3.1* 1.4    Estimated Creatinine Clearance: 67.2 mL/min (by C-G formula based on SCr of 0.95 mg/dL).    No Known Allergies  Thank you for allowing pharmacy to be a part of this patient's care.  Carola FrostNathan A Leisa Gault, Pharm.D., BCPS Clinical Pharmacist 01/16/2017 12:40 PM

## 2017-01-16 NOTE — Consult Note (Signed)
ORTHOPAEDIC CONSULTATION  REQUESTING PHYSICIAN: Alford HighlandWieting, Richard, MD  Chief Complaint: Left forearm erythema and swelling  HPI: Ross Montgomery is a 76 y.o. male who complains of swelling in the left forearm which has developed and been slowly progressive over the past several days. Patient has been afebrile and demonstrates no fevers or other signs of systemic illness.  Patient denies numbness or tingling or loss of motion or weakness in the left upper extremity.  Past Medical History:  Diagnosis Date  . COPD (chronic obstructive pulmonary disease) (HCC)   . Hypertension    History reviewed. No pertinent surgical history. Social History   Social History  . Marital status: Married    Spouse name: N/A  . Number of children: N/A  . Years of education: N/A   Social History Main Topics  . Smoking status: Former Smoker    Types: Cigarettes  . Smokeless tobacco: Never Used  . Alcohol use No  . Drug use: No  . Sexual activity: Yes   Other Topics Concern  . None   Social History Narrative  . None   Family History  Problem Relation Age of Onset  . Heart disease Mother   . Heart disease Father   . Non-Hodgkin's lymphoma Sister   . Non-Hodgkin's lymphoma Brother    No Known Allergies Prior to Admission medications   Medication Sig Start Date End Date Taking? Authorizing Provider  albuterol (PROVENTIL HFA;VENTOLIN HFA) 108 (90 Base) MCG/ACT inhaler Inhale 4-6 puffs by mouth every 4 hours as needed for wheezing, cough, and/or shortness of breath 02/19/16  Yes Loleta RoseForbach, Cory, MD  cephALEXin (KEFLEX) 500 MG capsule Take 1 capsule (500 mg total) by mouth 4 (four) times daily. 01/14/17 01/24/17  Emily FilbertWilliams, Jonathan E, MD  HYDROcodone-acetaminophen (NORCO/VICODIN) 5-325 MG tablet 1 tablet every 4-6 hours prn pain Patient not taking: Reported on 01/14/2017 12/24/16   Tommi RumpsSummers, Rhonda L, PA-C  ondansetron [facility](ZOFRAN) 4 MG tablet Take 1-2 tabs by mouth every 8 hours as needed for  nausea/vomiting Patient not taking: Reported on 01/14/2017 02/19/16   Loleta RoseForbach, Cory, MD  predniSONE (DELTASONE) 20 MG tablet Take 2 tablets (40 mg total) by mouth daily. Patient not taking: Reported on 01/14/2017 02/19/16   Loleta RoseForbach, Cory, MD  sulfamethoxazole-trimethoprim (BACTRIM DS,SEPTRA DS) 800-160 MG tablet Take 2 tablets by mouth 2 (two) times daily. 01/14/17   Emily FilbertWilliams, Jonathan E, MD   Dg Chest 2 View  Result Date: 01/14/2017 CLINICAL DATA:  Shortness of breath.  Left upper extremity swelling EXAM: CHEST  2 VIEW COMPARISON:  February 18, 2016 FINDINGS: There is slight apparent scarring in the lingula. There is no edema or consolidation. Heart size and pulmonary vascularity are normal. No adenopathy. There is aortic atherosclerosis. No bone lesions. IMPRESSION: Mild lingular scarring. No edema or consolidation. There is aortic atherosclerosis. Electronically Signed   By: Bretta BangWilliam  Woodruff III M.D.   On: 01/14/2017 14:19   [place] Venous Img Upper Uni Left  Result Date: 01/14/2017 CLINICAL DATA:  Swelling. EXAM: LEFT UPPER EXTREMITY VENOUS DOPPLER ULTRASOUND TECHNIQUE: Gray-scale sonography with graded compression, as well as color Doppler and duplex ultrasound were performed to evaluate the upper extremity deep venous system from the level of the subclavian vein and including the jugular, axillary, basilic, radial, ulnar and upper cephalic vein. Spectral Doppler was utilized to evaluate flow at rest and with distal augmentation maneuvers. COMPARISON:  None. FINDINGS: Contralateral Subclavian Vein: Respiratory phasicity is normal and symmetric with the symptomatic side. No evidence of thrombus. Normal compressibility. Internal  Jugular Vein: No evidence of thrombus. Normal compressibility, respiratory phasicity and response to augmentation. Subclavian Vein: No evidence of thrombus. Normal compressibility, respiratory phasicity and response to augmentation. Axillary Vein: No evidence of thrombus. Normal  compressibility, respiratory phasicity and response to augmentation. Cephalic Vein: No evidence of thrombus. Normal compressibility, respiratory phasicity and response to augmentation. Basilic Vein: No evidence of thrombus. Normal compressibility, respiratory phasicity and response to augmentation. Brachial Veins: No evidence of thrombus. Normal compressibility, respiratory phasicity and response to augmentation. Radial Veins: No evidence of thrombus. Normal compressibility, respiratory phasicity and response to augmentation. Ulnar Veins: No evidence of thrombus. Normal compressibility, respiratory phasicity and response to augmentation. Venous Reflux:  None visualized. Other Findings:  None visualized. IMPRESSION: No evidence of DVT within the left upper extremity. Electronically Signed   By: Gerome Sam III M.D   On: 01/14/2017 16:37    Positive ROS: All other systems have been reviewed and were otherwise negative with the exception of those mentioned in the HPI and as above.  Physical Exam: General: Alert, no acute distress  MUSCULOSKELETAL: Left upper extremity: Patient was seen sitting onset of his bed. He has moderate diffuse erythema and swelling in the left forearm.  His compartments remain soft and compressible. The erythema and swelling extending from above his left elbow to the dorsum of his left hand. His fingers are uninvolved. He can flex and extend all digits and has intact sensation light touch. He can actively flex and extend his wrist and elbow without pain.  His elbow flexion is slightly limited due to the swelling. Patient does not appear to have an effusion of either his elbow or wrist joint but rather has diffuse edema. There is no area of fluctuance for focal swelling at this point.  Assessment: Left forearm swelling and edema secondary to cellulitis versus abscess  Plan: I splinted the patient the possibility of an abscess in his left forearm versus cellulitis. Nor to make  the distinction I'm recommending MRI evaluation of left forearm to include his left wrist and elbow joints. He will continue on IV antibiotics as prescribed by the hospitalist service. He is currently on IV cefazolin.  If the patient does not improve, he may need broader spectrum antibiotics.  I will follow up with more recommendations once the MRI has been completed. If there is an abscess he may need surgical drainage. If it just shows diffuse edema with cellulitis IV antibiotic treatment will be definitive management.    Juanell Fairly, MD    01/16/2017 11:32 AM

## 2017-01-16 NOTE — Progress Notes (Addendum)
Patient ID: Ross Montgomery Hasley, male   DOB: [birth year], 76 y.o.   MRN: [id number]  [facility] PROGRESS NOTE  Ross Montgomery Weisensel [id number] DOB: [birth year] DOA: 01/14/2017 PCP: Patient, No Pcp Per  Subjective; Patient feeling okay. Upset that he has to stay in the hospital further. He states he's able to move his arm better. His arm is still swollen. He did have pain when they took his blood pressure last night on his left arm.  Objective: Vitals:   01/16/17 0530 01/16/17 0804  BP: (!) 141/79 (!) 149/79  Pulse: 74 75  Resp: 18 18  Temp: 97.8 F (36.6 C) 98.1 F (36.7 C)    Filed Weights   01/14/17 1338 01/14/17 2052 01/16/17 0617  Weight: 74.8 kg (165 lb) 78.1 kg (172 lb 4 oz) 78 kg (172 lb)    ROS: Review of Systems  Constitutional: Negative for chills and fever.  Eyes: Negative for blurred vision.  Respiratory: Negative for cough and shortness of breath.   Cardiovascular: Negative for chest pain.  Gastrointestinal: Negative for abdominal pain, constipation, diarrhea, nausea and vomiting.  Genitourinary: Negative for dysuria.  Musculoskeletal: Negative for joint pain.  Neurological: Negative for dizziness and headaches.   Exam: Physical Exam  Constitutional: He is oriented to person, place, and time.  HENT:  Nose: No mucosal edema.  Mouth/Throat: No oropharyngeal exudate or posterior oropharyngeal edema.  Eyes: Conjunctivae, EOM and lids are normal. Pupils are equal, round, and reactive to light.  Neck: No JVD present. Carotid bruit is not present. No edema present. No thyroid mass and no thyromegaly present.  Cardiovascular: S1 normal and S2 normal.  Exam reveals no gallop.   No murmur heard. Pulses:      Dorsalis pedis pulses are 2+ on the right side, and 2+ on the left side.  Respiratory: No respiratory distress. He has no wheezes. He has no rhonchi. He has no rales.  GI: Soft. Bowel sounds are normal. There is no tenderness.  Musculoskeletal:       Right shoulder:  He exhibits no swelling.       Right forearm: He exhibits tenderness, swelling and edema.  Lymphadenopathy:    He has no cervical adenopathy.  Neurological: He is alert and oriented to person, place, and time. No cranial nerve deficit.  Skin: Skin is warm. Nails show no clubbing.  Left arm, elbow and forearm swollen and red  Psychiatric: He has a normal mood and affect.      Data Reviewed: Basic Metabolic Panel:  Recent Labs Lab 01/14/17 1340  NA 136  K 4.2  CL 102  CO2 23  GLUCOSE 122*  BUN 19  CREATININE 0.95  CALCIUM 9.7   Liver Function Tests:  Recent Labs Lab 01/14/17 1340  AST 23  ALT 20  ALKPHOS 72  BILITOT 0.8  PROT 7.7  ALBUMIN 4.2   CBC:  Recent Labs Lab 01/14/17 1340 01/16/17 0556  WBC 11.5* 12.4*  NEUTROABS 9.2*  --   HGB 13.8 12.1*  HCT 40.5 35.6*  MCV 88.0 90.1  PLT 265 239   Cardiac Enzymes:  Recent Labs Lab 01/14/17 1340  TROPONINI <0.03   BNP (last 3 results)  Recent Labs  02/18/16 2307  BNP 12.0      Studies: Dg Chest 2 View  Result Date: 01/14/2017 CLINICAL DATA:  Shortness of breath.  Left upper extremity swelling EXAM: CHEST  2 VIEW COMPARISON:  February 18, 2016 FINDINGS: There is slight apparent scarring in the lingula. There is  no edema or consolidation. Heart size and pulmonary vascularity are normal. No adenopathy. There is aortic atherosclerosis. No bone lesions. IMPRESSION: Mild lingular scarring. No edema or consolidation. There is aortic atherosclerosis. Electronically Signed   By: Bretta Bang III M.D.   On: 01/14/2017 14:19   US Venous Img Upper Uni Left  Result Date: 01/14/2017 CLINICAL DATA:  Swelling. EXAM: LEFT UPPER EXTREMITY VENOUS DOPPLER ULTRASOUND TECHNIQUE: Gray-scale sonography with graded compression, as well as color Doppler and duplex ultrasound were performed to evaluate the upper extremity deep venous system from the level of the subclavian vein and including the jugular, axillary, basilic,  radial, ulnar and upper cephalic vein. Spectral Doppler was utilized to evaluate flow at rest and with distal augmentation maneuvers. COMPARISON:  None. FINDINGS: Contralateral Subclavian Vein: Respiratory phasicity is normal and symmetric with the symptomatic side. No evidence of thrombus. Normal compressibility. Internal Jugular Vein: No evidence of thrombus. Normal compressibility, respiratory phasicity and response to augmentation. Subclavian Vein: No evidence of thrombus. Normal compressibility, respiratory phasicity and response to augmentation. Axillary Vein: No evidence of thrombus. Normal compressibility, respiratory phasicity and response to augmentation. Cephalic Vein: No evidence of thrombus. Normal compressibility, respiratory phasicity and response to augmentation. Basilic Vein: No evidence of thrombus. Normal compressibility, respiratory phasicity and response to augmentation. Brachial Veins: No evidence of thrombus. Normal compressibility, respiratory phasicity and response to augmentation. Radial Veins: No evidence of thrombus. Normal compressibility, respiratory phasicity and response to augmentation. Ulnar Veins: No evidence of thrombus. Normal compressibility, respiratory phasicity and response to augmentation. Venous Reflux:  None visualized. Other Findings:  None visualized. IMPRESSION: No evidence of DVT within the left upper extremity. Electronically Signed   By: Gerome Sam III M.D   On: 01/14/2017 16:37    Scheduled Meds: . albuterol  2.5 mg Nebulization BID  . docusate sodium  100 mg Oral BID  . enoxaparin (LOVENOX) injection  40 mg Subcutaneous Q24H  . lidocaine-EPINEPHrine  20 mL Intradermal Once  . pneumococcal 23 valent vaccine  0.5 mL Intramuscular Tomorrow-1000  . predniSONE  20 mg Oral Daily  . tiotropium  18 mcg Inhalation Daily   Continuous Infusions: . ampicillin-sulbactam (UNASYN) IV    . vancomycin 1,000 mg (01/16/17 1302)  . vancomycin       Assessment/Plan:  1. Cellulitis and likely left elbow infected bursitis. Lactic acidosis has improved. Case discussed with Dr. Martha Clan orthopedic surgery and he ordered an MRI to rule out abscess. Got more aggressive with antibiotics and changed to Ancef over to Unasyn and vancomycin. 2. COPD exacerbation. Taper Prednisone, nebulizer treatments and Spiriva 3. History of hypertension but last blood pressure in normal range.  Code Status:     Code Status Orders        Start     Ordered   01/14/17 2026  Full code  Continuous     01/14/17 2025    Code Status History    Date Active Date Inactive Code Status Order ID Comments User Context   This patient has a current code status but no historical code status.     Family Communication: Spoke with wife on the phone Yesterday Disposition Plan: Cellulitis and swelling will need to improve prior to disposition  Antibiotics:  Unasyn  Vancomycin  Time spent: 26 minutes  Alford Highland  [employer]

## 2017-01-17 LAB — CBC
HEMATOCRIT: 35.5 % — AB (ref 40.0–52.0)
Hemoglobin: 12 g/dL — ABNORMAL LOW (ref 13.0–18.0)
MCH: 30.1 pg (ref 26.0–34.0)
MCHC: 33.8 g/dL (ref 32.0–36.0)
MCV: 89 fL (ref 80.0–100.0)
PLATELETS: 255 10*3/uL (ref 150–440)
RBC: 3.99 MIL/uL — AB (ref 4.40–5.90)
RDW: 14 % (ref 11.5–14.5)
WBC: 10.6 10*3/uL (ref 3.8–10.6)

## 2017-01-17 MED ORDER — PREDNISONE 5 MG PO TABS: ORAL_TABLET | ORAL | 0 refills | Status: DC

## 2017-01-17 MED ORDER — TIOTROPIUM BROMIDE MONOHYDRATE 18 MCG IN CAPS: 18.0000 ug | ORAL_CAPSULE | Freq: Every day | RESPIRATORY_TRACT | 12 refills | Status: AC

## 2017-01-17 MED ORDER — AMOXICILLIN-POT CLAVULANATE 875-125 MG PO TABS: 1.0000 | ORAL_TABLET | Freq: Two times a day (BID) | ORAL | 0 refills | Status: AC

## 2017-01-17 MED ORDER — DOXYCYCLINE HYCLATE 100 MG PO TABS: 100.0000 mg | ORAL_TABLET | Freq: Two times a day (BID) | ORAL | Status: DC

## 2017-01-17 MED ORDER — DOXYCYCLINE HYCLATE 100 MG PO TABS
100.0000 mg | ORAL_TABLET | Freq: Two times a day (BID) | ORAL | 0 refills | Status: DC
Start: 2017-01-17 — End: 2018-03-14

## 2017-01-17 NOTE — Care Management Important Message (Signed)
Important Message  Patient Details  Name: Ross Montgomery MRN: [id number] Date of Birth: 1940/09/16   Medicare Important Message Given:  Yes    Gwenette GreetBrenda S Granite Godman, RN 01/17/2017, 8:47 AM

## 2017-01-17 NOTE — Discharge Summary (Signed)
[facility] - [place] at [facility]   PATIENT NAME: Ross Montgomery    MR#:  [id number]  DATE OF BIRTH:  1940-09-09  DATE OF ADMISSION:  01/14/2017 ADMITTING PHYSICIAN: Arnaldo NatalMichael S Diamond, MD  DATE OF DISCHARGE: 01/17/2017  PRIMARY CARE PHYSICIAN: Patient, No Pcp Per    ADMISSION DIAGNOSIS:  Lactic acidosis [E87.2] Cellulitis of left upper extremity [L03.114]  DISCHARGE DIAGNOSIS:  Active Problems:   Cellulitis   SECONDARY DIAGNOSIS:   Past Medical History:  Diagnosis Date  . COPD (chronic obstructive pulmonary disease) (HCC)   . Hypertension     HOSPITAL COURSE:   1.   Cellulitis and left elbow infected olecranon bursitis. Lactic acidosis. Patient was given IV cephalexin initially. Antibiotics switched over to Unasyn and vancomycin. The patient had a significant improvement once this antibiotic was changed. MRI did show a fluid collection in the olecranon bursa. Since it improved overnight, Dr. Martha ClanKrasinski will follow-up in the office and no need for drainage at this point in time. I will discharge home on Augmentin and doxycycline. 2. COPD exacerbation taper prednisone off quickly. Hospital home Spiriva program 3. History of hypertension but blood pressure and high normal range. Continue to monitor as outpatient.  DISCHARGE CONDITIONS:   Satisfactory  CONSULTS OBTAINED:  Treatment Team:  Juanell FairlyKrasinski, Kevin, MD  DRUG ALLERGIES:  No Known Allergies  DISCHARGE MEDICATIONS:   Current Discharge Medication List    START taking these medications   Details  amoxicillin-clavulanate (AUGMENTIN) 875-125 MG tablet Take 1 tablet by mouth 2 (two) times daily. Qty: 14 tablet, Refills: 0    doxycycline (VIBRA-TABS) 100 MG tablet Take 1 tablet (100 mg total) by mouth every 12 (twelve) hours. Qty: 14 tablet, Refills: 0    tiotropium (SPIRIVA) 18 MCG inhalation capsule Place 1 capsule (18 mcg total) into inhaler and inhale daily. Qty: 30 capsule, Refills: 12       CONTINUE these medications which have CHANGED   Details  predniSONE (DELTASONE) 5 MG tablet 3 tabs po day 1; 2 tabs po day2; 1 tab po day3 Qty: 6 tablet, Refills: 0      CONTINUE these medications which have NOT CHANGED   Details  albuterol (PROVENTIL HFA;VENTOLIN HFA) 108 (90 Base) MCG/ACT inhaler Inhale 4-6 puffs by mouth every 4 hours as needed for wheezing, cough, and/or shortness of breath Qty: 1 Inhaler, Refills: 1      STOP taking these medications     HYDROcodone-acetaminophen (NORCO/VICODIN) 5-325 MG tablet      ondansetron (ZOFRAN) 4 MG tablet      cephALEXin (KEFLEX) 500 MG capsule          DISCHARGE INSTRUCTIONS:   Follow-up with Dr. Martha ClanKrasinski in a few days Care manager team did give him numbers and names of primary care physicians in the area.  If you experience worsening of your admission symptoms, develop shortness of breath, life threatening emergency, suicidal or homicidal thoughts you must seek medical attention immediately by calling 911 or calling your MD immediately  if symptoms less severe.  You Must read complete instructions/literature along with all the possible adverse reactions/side effects for all the Medicines you take and that have been prescribed to you. Take any new Medicines after you have completely understood and accept all the possible adverse reactions/side effects.   Please note  You were cared for by a hospitalist during your hospital stay. If you have any questions about your discharge medications or the care you received while you were in the  hospital after you are discharged, you can call the unit and asked to speak with the hospitalist on call if the hospitalist that took care of you is not available. Once you are discharged, your primary care physician will handle any further medical issues. Please note that NO REFILLS for any discharge medications will be authorized once you are discharged, as it is imperative that you return to your  primary care physician (or establish a relationship with a primary care physician if you do not have one) for your aftercare needs so that they can reassess your need for medications and monitor your lab values.    Today   CHIEF COMPLAINT:   Chief Complaint  Patient presents with  . Shortness of Breath  . Arm Pain    HISTORY OF PRESENT ILLNESS:  Ross Montgomery  is a 76 y.o. male came in with arm pain   VITAL SIGNS:  Blood pressure (!) 164/83, pulse 82, temperature 97.7 F (36.5 C), temperature source Oral, resp. rate 20, height 5\' 9"  (1.753 m), weight 77.8 kg (171 lb 8 oz), SpO2 94 %.    PHYSICAL EXAMINATION:  GENERAL:  76 y.o.-year-old patient lying in the bed with no acute distress.  EYES: Pupils equal, round, reactive to light and accommodation. No scleral icterus. Extraocular muscles intact.  HEENT: Head atraumatic, normocephalic. Oropharynx and nasopharynx clear.  NECK:  Supple, no jugular venous distention. No thyroid enlargement, no tenderness.  LUNGS: Normal breath sounds bilaterally, no wheezing, rales,rhonchi or crepitation. No use of accessory muscles of respiration.  CARDIOVASCULAR: S1, S2 normal. No murmurs, rubs, or gallops.  ABDOMEN: Soft, non-tender, non-distended. Bowel sounds present. No organomegaly or mass.  EXTREMITIES: No pedal edema, cyanosis, or clubbing. Left elbow swelling much improved. The fullness that was there yesterday is down tremendously.  NEUROLOGIC: Cranial nerves II through XII are intact. Muscle strength 5/5 in all extremities. Sensation intact. Gait not checked.  PSYCHIATRIC: The patient is alert and oriented x 3.  SKIN: Erythema fading on the left arm. Still on the medial side is present. Over the elbow much improved from yesterday.  DATA REVIEW:   CBC  Recent Labs Lab 01/17/17 0454  WBC 10.6  HGB 12.0*  HCT 35.5*  PLT 255    Chemistries   Recent Labs Lab 01/14/17 1340  NA 136  K 4.2  CL 102  CO2 23  GLUCOSE 122*  BUN  19  CREATININE 0.95  CALCIUM 9.7  AST 23  ALT 20  ALKPHOS 72  BILITOT 0.8    Cardiac Enzymes  Recent Labs Lab 01/14/17 1340  TROPONINI <0.03      RADIOLOGY:  Mr Forearm Left W Wo Contrast  Result Date: 01/17/2017 CLINICAL DATA:  Ross Montgomery is a 76 y.o. male who complains of swelling in the left forearm which has developed and been slowly progressive over the past several days. Patient has been afebrile. EXAM: MRI OF THE LEFT FOREARM WITHOUT AND WITH CONTRAST TECHNIQUE: Multiplanar, multisequence MR imaging of the left forearm was performed before and after the administration of intravenous contrast. CONTRAST:  15mL MULTIHANCE GADOBENATE DIMEGLUMINE 529 MG/ML IV SOLN COMPARISON:  None. FINDINGS: Bones/Joint/Cartilage No marrow signal abnormality. No fracture or dislocation. Normal alignment. No joint effusion. Ligaments Collateral ligaments are intact. Muscles and Tendons Mild muscle edema in the proximal flexor digitorum profundus likely reactive. Muscles are otherwise normal. No intramuscular fluid collection or hematoma. Soft tissue Generalized soft tissue edema throughout the forearm within the subcutaneous fat as can be seen  with cellulitis. 2.1 x 1.8 x 5.4 cm peripherally enhancing fluid collection overlying the olecranon as can be seen with olecranon bursitis. Secondarily infected bursa cannot be excluded. IMPRESSION: 1. Generalized soft tissue edema throughout the forearm within the subcutaneous fat as can be seen with cellulitis. 2.1 x 1.8 x 5.4 cm peripherally enhancing fluid collection overlying the olecranon as can be seen with olecranon bursitis. Secondarily infected bursa cannot be excluded. Electronically Signed   By: Elige Ko   On: 01/17/2017 08:31    Management plans discussed with the patient, family and they are in agreement.  CODE STATUS:     Code Status Orders        Start     Ordered   01/14/17 2026  Full code  Continuous     01/14/17 2025    Code  Status History    Date Active Date Inactive Code Status Order ID Comments User Context   This patient has a current code status but no historical code status.      TOTAL TIME TAKING CARE OF THIS PATIENT: 35 minutes.    Alford Highland M.D on 01/17/2017 at 2:36 PM  Between 7am to 6pm - Pager - [phone]  After 6pm go to [email or web address] - password [email or web address] [facility]  [facility] Office  [phone]  CC: Primary care physician; Patient, No Pcp Per

## 2017-01-17 NOTE — Progress Notes (Signed)
Patient discharged home per MD order. Prescriptions given to patient. All discharge instructions given and all questions answered. Patient verbalized understanding of all discharge instructions. 

## 2017-01-17 NOTE — Care Management (Signed)
Admitted to [facility] with the diagnosis of left arm cellulitis. Lives with wife, Ross Montgomery. [phone]). Retired from [employer]. Moved [place] from [place] 15 years ago. Lives in [place]. Takes care of all basic activities of daily living himself, drives. No medical equipment. No pharmacy. No primary care physician. No falls. Good appetite. Family will transport. Received referral for needing help with medications. Ross Montgomery has Medicare part A. States he can't afford part B. Information about [facility] and [facility] goven. Also gave information about physicians accepting new patients. States he may ne able to [facility], that is where his wife goes. Ross Montgomery would be able to help with prescriptions. Possible discharge later today Ross GreetBrenda S Sidonia Nutter RN MSN CCM Care Management [phone]

## 2017-01-17 NOTE — Progress Notes (Signed)
Subjective:  She was seen in his room today sitting up in a chair. Patient reports pain as mild.  He states his left elbow feels much improved.  Patient has an MRI of the left elbow yesterday showing diffuse edema consistent with cellulitis and a small focal collection of fluid in the left olecranon bursa. His white count has improved.  Patient has been afebrile and denies any fevers or chills.  Objective:   VITALS:   Vitals:   01/17/17 0420 01/17/17 0500 01/17/17 0738 01/17/17 1246  BP: (!) 143/74   (!) 164/83  Pulse: 75   82  Resp: 19   20  Temp: 98.4 F (36.9 C)   97.7 F (36.5 C)  TempSrc: Oral   Oral  SpO2: 93%  93% 94%  Weight:  77.8 kg (171 lb 8 oz)    Height:        PHYSICAL EXAM:  Left upper extremity: Patient's skin remains intact. He has had dramatic improvement in his erythema and swelling overnight. Patient has no tenderness to palpation over the olecranon bursa. There is no significant fluid collection in the olecranon bursa and no other areas of fluctuance with examination of his left upper extremity. The erythema has receded from its previous borders. The edema is remarkably improved from yesterday. Patient can flex and extend his elbow fully and can rotate his forearm, flex and extend his wrist and move all fingers without pain.  He has a palpable radial pulse. His fingers are well-perfused.     LABS  Results for orders placed or performed during the hospital encounter of 01/14/17 (from the past 24 hour(s))  CBC     Status: Abnormal   Collection Time: 01/17/17  4:54 AM  Result Value Ref Range   WBC 10.6 3.8 - 10.6 K/uL   RBC 3.99 (L) 4.40 - 5.90 MIL/uL   Hemoglobin 12.0 (L) 13.0 - 18.0 g/dL   HCT 10.9 (L) 60.4 - 54.0 %   MCV 89.0 80.0 - 100.0 fL   MCH 30.1 26.0 - 34.0 pg   MCHC 33.8 32.0 - 36.0 g/dL   RDW 98.1 19.1 - 47.8 %   Platelets 255 150 - 440 K/uL    Mr Forearm Left W Wo Contrast  Result Date: 01/17/2017 CLINICAL DATA:  Ross Montgomery is a 76 y.o.  male who complains of swelling in the left forearm which has developed and been slowly progressive over the past several days. Patient has been afebrile. EXAM: MRI OF THE LEFT FOREARM WITHOUT AND WITH CONTRAST TECHNIQUE: Multiplanar, multisequence MR imaging of the left forearm was performed before and after the administration of intravenous contrast. CONTRAST:  15mL MULTIHANCE GADOBENATE DIMEGLUMINE 529 MG/ML IV SOLN COMPARISON:  None. FINDINGS: Bones/Joint/Cartilage No marrow signal abnormality. No fracture or dislocation. Normal alignment. No joint effusion. Ligaments Collateral ligaments are intact. Muscles and Tendons Mild muscle edema in the proximal flexor digitorum profundus likely reactive. Muscles are otherwise normal. No intramuscular fluid collection or hematoma. Soft tissue Generalized soft tissue edema throughout the forearm within the subcutaneous fat as can be seen with cellulitis. 2.1 x 1.8 x 5.4 cm peripherally enhancing fluid collection overlying the olecranon as can be seen with olecranon bursitis. Secondarily infected bursa cannot be excluded. IMPRESSION: 1. Generalized soft tissue edema throughout the forearm within the subcutaneous fat as can be seen with cellulitis. 2.1 x 1.8 x 5.4 cm peripherally enhancing fluid collection overlying the olecranon as can be seen with olecranon bursitis. Secondarily infected bursa cannot be  excluded. Electronically Signed   By: Elige KoHetal  Patel   On: 01/17/2017 08:31    Assessment/Plan:     Active Problems:   Cellulitis  Patient is clinically much improved. I reviewed his MRI which showed a small fluid collection in the bursa. Otherwise he had diffuse edema in the forearm consistent with cellulitis. Given the fact that the patient has had such significant improvement on his IV antibiotics and there is very little focal swelling over the olecranon bursa despite the MRI, the decision was made to hold on surgical incision and drainage. Patient will be  discharged today on Augmentin and doxycycline.  He will follow-up in my office in 3 days for reevaluation.  Patient will contact my office or return to the ER immediately if he sees worsening erythema or swelling.      Juanell FairlyKRASINSKI, Ross Montgomery , MD 01/17/2017, 1:47 PM

## 2017-01-17 NOTE — Discharge Instructions (Signed)
With antibiotics Doxycycline- need to take with food and avoid prolonged sun exposure

## 2017-12-24 ENCOUNTER — Other Ambulatory Visit: Payer: Self-pay

## 2017-12-24 DIAGNOSIS — M7989 Other specified soft tissue disorders: Secondary | ICD-10-CM | POA: Insufficient documentation

## 2017-12-24 DIAGNOSIS — I10 Essential (primary) hypertension: Secondary | ICD-10-CM | POA: Insufficient documentation

## 2017-12-24 DIAGNOSIS — L02414 Cutaneous abscess of left upper limb: Secondary | ICD-10-CM | POA: Insufficient documentation

## 2017-12-24 DIAGNOSIS — L03114 Cellulitis of left upper limb: Secondary | ICD-10-CM | POA: Insufficient documentation

## 2017-12-24 DIAGNOSIS — Z87891 Personal history of nicotine dependence: Secondary | ICD-10-CM | POA: Insufficient documentation

## 2017-12-24 DIAGNOSIS — J449 Chronic obstructive pulmonary disease, unspecified: Secondary | ICD-10-CM | POA: Insufficient documentation

## 2017-12-24 DIAGNOSIS — Z79899 Other long term (current) drug therapy: Secondary | ICD-10-CM | POA: Insufficient documentation

## 2017-12-24 NOTE — ED Triage Notes (Signed)
Patient with area to left elbow for a week.  Reports tonight noticed drainage.

## 2017-12-25 ENCOUNTER — Emergency Department: Payer: Self-pay

## 2017-12-25 ENCOUNTER — Emergency Department
Admission: EM | Admit: 2017-12-25 | Discharge: 2017-12-25 | Disposition: A | Discharge status: Home / Self Care | Payer: Self-pay | Attending: Emergency Medicine | Admitting: Emergency Medicine

## 2017-12-25 DIAGNOSIS — M7989 Other specified soft tissue disorders: Secondary | ICD-10-CM

## 2017-12-25 DIAGNOSIS — L03114 Cellulitis of left upper limb: Secondary | ICD-10-CM

## 2017-12-25 DIAGNOSIS — L0291 Cutaneous abscess, unspecified: Secondary | ICD-10-CM

## 2017-12-25 MED ORDER — CLINDAMYCIN HCL 300 MG PO CAPS: 300.0000 mg | ORAL_CAPSULE | Freq: Three times a day (TID) | ORAL | 0 refills | Status: AC

## 2017-12-25 MED ORDER — CLINDAMYCIN HCL 150 MG PO CAPS
300.0000 mg | ORAL_CAPSULE | Freq: Once | ORAL | Status: AC
  Administered 2017-12-25: 300 mg via ORAL
  Filled 2017-12-25: qty 2

## 2017-12-25 NOTE — ED Provider Notes (Signed)
[facility] Emergency Department Provider Note   ____________________________________________   First MD Initiated Contact with Patient 12/25/17 0451     (approximate)  I have reviewed the triage vital signs and the nursing notes.   HISTORY  Chief Complaint Abscess    HPI Ross Montgomery is a 77 y.o. male who comes into the hospital today with some swelling below his left elbow.  He reports that he noticed this area multiple weeks ago and he has been putting compresses on it.  He reports though that it developed a head and he popped it today.  Pus came out of it and he was concerned so he wanted to come and get it checked out.  The patient reports that the redness and swelling is not as bad as it was before.  He denies any fever or pain to the area.  He is unsure if it needs to get lanced.   Past Medical History:  Diagnosis Date  . COPD (chronic obstructive pulmonary disease) (HCC)   . Hypertension     Patient Active Problem List   Diagnosis Date Noted  . Cellulitis 01/14/2017    No past surgical history on file.  Prior to Admission medications   Medication Sig Start Date End Date Taking? Authorizing Provider  albuterol (PROVENTIL HFA;VENTOLIN HFA) 108 (90 Base) MCG/ACT inhaler Inhale 4-6 puffs by mouth every 4 hours as needed for wheezing, cough, and/or shortness of breath 02/19/16   Loleta Rose, MD  clindamycin (CLEOCIN) 300 MG capsule Take 1 capsule (300 mg total) by mouth 3 (three) times daily for 10 days. 12/25/17 01/04/18  Rebecka Apley, MD  doxycycline (VIBRA-TABS) 100 MG tablet Take 1 tablet (100 mg total) by mouth every 12 (twelve) hours. 01/17/17   Alford Highland, MD  predniSONE (DELTASONE) 5 MG tablet 3 tabs po day 1; 2 tabs po day2; 1 tab po day3 01/17/17   Alford Highland, MD  tiotropium (SPIRIVA) 18 MCG inhalation capsule Place 1 capsule (18 mcg total) into inhaler and inhale daily. 01/18/17   Alford Highland, MD     Allergies Patient has no known allergies.  Family History  Problem Relation Age of Onset  . Heart disease Mother   . Heart disease Father   . Non-Hodgkin's lymphoma Sister   . Non-Hodgkin's lymphoma Brother     Social History Social History   Tobacco Use  . Smoking status: Former Smoker    Types: Cigarettes  . Smokeless tobacco: Never Used  Substance Use Topics  . Alcohol use: No  . Drug use: No    Review of Systems  Constitutional: No fever/chills Eyes: No visual changes. ENT: No sore throat. Cardiovascular: Denies chest pain. Respiratory: Denies shortness of breath. Gastrointestinal: No abdominal pain.   Genitourinary: Negative for dysuria. Musculoskeletal: Negative for back pain. Skin: Redness to left elbow Neurological: Negative for headaches, focal weakness or numbness.   ____________________________________________   PHYSICAL EXAM:  VITAL SIGNS: ED Triage Vitals [12/24/17 2344]  Enc Vitals Group     BP (!) 162/82     Pulse Rate 75     Resp 18     Temp (!) 97.4 F (36.3 C)     Temp Source Oral     SpO2 98 %     Weight 160 lb (72.6 kg)     Height  (1.753 m)     Head Circumference      Peak Flow      Pain Score 0  Pain Loc      Pain Edu?      Excl. in GC?     Constitutional: Alert and oriented. Well appearing and in mild distress. Eyes: Conjunctivae are normal. PERRL. EOMI. Head: Atraumatic. Nose: No congestion/rhinnorhea. Mouth/Throat: Mucous membranes are moist.  Oropharynx non-erythematous. Cardiovascular: Normal rate, regular rhythm. Grossly normal heart sounds.  Good peripheral circulation. Respiratory: Normal respiratory effort.  No retractions. Lungs CTAB. Gastrointestinal: Soft and nontender.  Positive bowel sounds  Musculoskeletal: No lower extremity tenderness nor edema.   Neurologic:  Normal speech and language.  Skin:  Skin is warm, dry and intact.  Mild erythema to area below the level of the elbow Psychiatric:  Mood and affect are normal.   ____________________________________________   LABS (all labs ordered are listed, but only abnormal results are displayed)  Labs Reviewed - No data to display ____________________________________________  EKG  none ____________________________________________  RADIOLOGY  ED MD interpretation:  US soft tissue left upper extremity: Focal complicated fluid collection just below the skin over the extensor surface of the elbow measuring 0.4 x 0.7 x 1.2 cm.  Smaller compared to the previous MRI from January 16, 2017.  May represent infected versus noninfected fluid collection, may be related to olecranon bursitis.  Official radiology report(s): [place] Lt Upper Extrem Ltd Soft Tissue Non Vascular  Result Date: 12/25/2017 CLINICAL DATA:  Quarter sized area over the left elbow soft tissues with swelling and drainage over the past week. EXAM: ULTRASOUND LEFT UPPER EXTREMITY LIMITED TECHNIQUE: Ultrasound examination of the upper extremity soft tissues was performed in the area of clinical concern. COMPARISON:  MRI left forearm 01/16/2017 FINDINGS: Ultrasound examination over the area of concern along the extensor surface of the elbow demonstrates mild skin thickening with an oval hypoechoic collection just below the skin with mild increased through transmission measuring 0.4 x 0.7 x 1.2 cm. This likely represents a minimally complicated fluid collection as seen on the previous MRI. May represent infected versus noninfected complicated fluid collection and may be related to olecranon bursitis as suggested previously. IMPRESSION: Focal complicated fluid collection just below the skin over the extensor surface of the elbow measuring 0.4 x 0.7 x 1.2 cm smaller compared to the previous MRI from 01/16/2017. This may represent infected versus noninfected fluid collection may be related to olecranon bursitis. Electronically Signed   By: Elberta Fortis M.D.   On: 12/25/2017 07:23     ____________________________________________   PROCEDURES  Procedure(s) performed: None  Procedures  Critical Care performed: No  ____________________________________________   INITIAL IMPRESSION / ASSESSMENT AND PLAN / ED COURSE  As part of my medical decision making, I reviewed the following data within the electronic MEDICAL RECORD NUMBER Notes from prior ED visits and [place] Controlled Substance Database   This is a 77 year old male who comes into the hospital today with some erythema over the lower portion of his elbow with a concern for possible abscess.  Evaluating the soft tissue swelling it appears that the patient may have a lipoma with an overlying cellulitis.  The patient has no significant tenderness and there is no fluctuance.  I will send the patient for an ultrasound looking for areas of fluid collection.  I will also give the patient a dose of clindamycin.  He will be reassessed once I receive the ultrasound results.  The patient's ultrasound showed a very small fluid collection that smaller than it had been previously.  Since there is some erythema I will give the patient some clindamycin for  home.  The patient should follow-up with surgery and his primary care physician to determine if he needs to have anything drained or removed.  Otherwise the patient has no other concerns.        ____________________________________________   FINAL CLINICAL IMPRESSION(S) / ED DIAGNOSES  Final diagnoses:  Arm swelling  Cellulitis of left upper extremity  Abscess     ED Discharge Orders        Ordered    clindamycin (CLEOCIN) 300 MG capsule  3 times daily     12/25/17 1610       Note:  This document was prepared using Dragon voice recognition software and may include unintentional dictation errors.    Rebecka Apley, MD 12/25/17 [id number]

## 2017-12-25 NOTE — ED Notes (Signed)
Pt with quarter sized area to left elbow with swelling noted; pt says area has been there for about 1 week and is intermittently draining milky white fluid now; no fever; no redness or warmth to surrounding area;

## 2017-12-25 NOTE — Discharge Instructions (Signed)
Your ultrasound shows a very small fluid collection as well as some infection.  Please follow-up with surgery and your primary care physician for further evaluation of the symptoms.  Please return with any worsening condition, fevers, worsening swelling or anything else.

## 2017-12-25 NOTE — ED Notes (Signed)
Verbal report to Kate, RN 

## 2017-12-25 NOTE — ED Notes (Signed)
Pt given coffee as requested; waiting patiently for treatment room

## 2017-12-25 NOTE — ED Notes (Signed)
Pt to [place] via wheelchair with tech, Rosey Bath

## 2017-12-25 NOTE — ED Notes (Addendum)
Antibiotic given as ordered; swelling to left elbow noted to be increased since pt's arrival; pt understands [place] has been ordered; pt was admitted here about 11 months ago with cellulitis of same extremity

## 2018-01-20 IMAGING — CR DG CHEST 2V
2 series · 2 of 2 positions shown · non-contrast
Comparison: February 18, 2016

CLINICAL DATA: Shortness of breath.  Left upper extremity swelling

EXAM:
CHEST  2 VIEW

[chest pa]
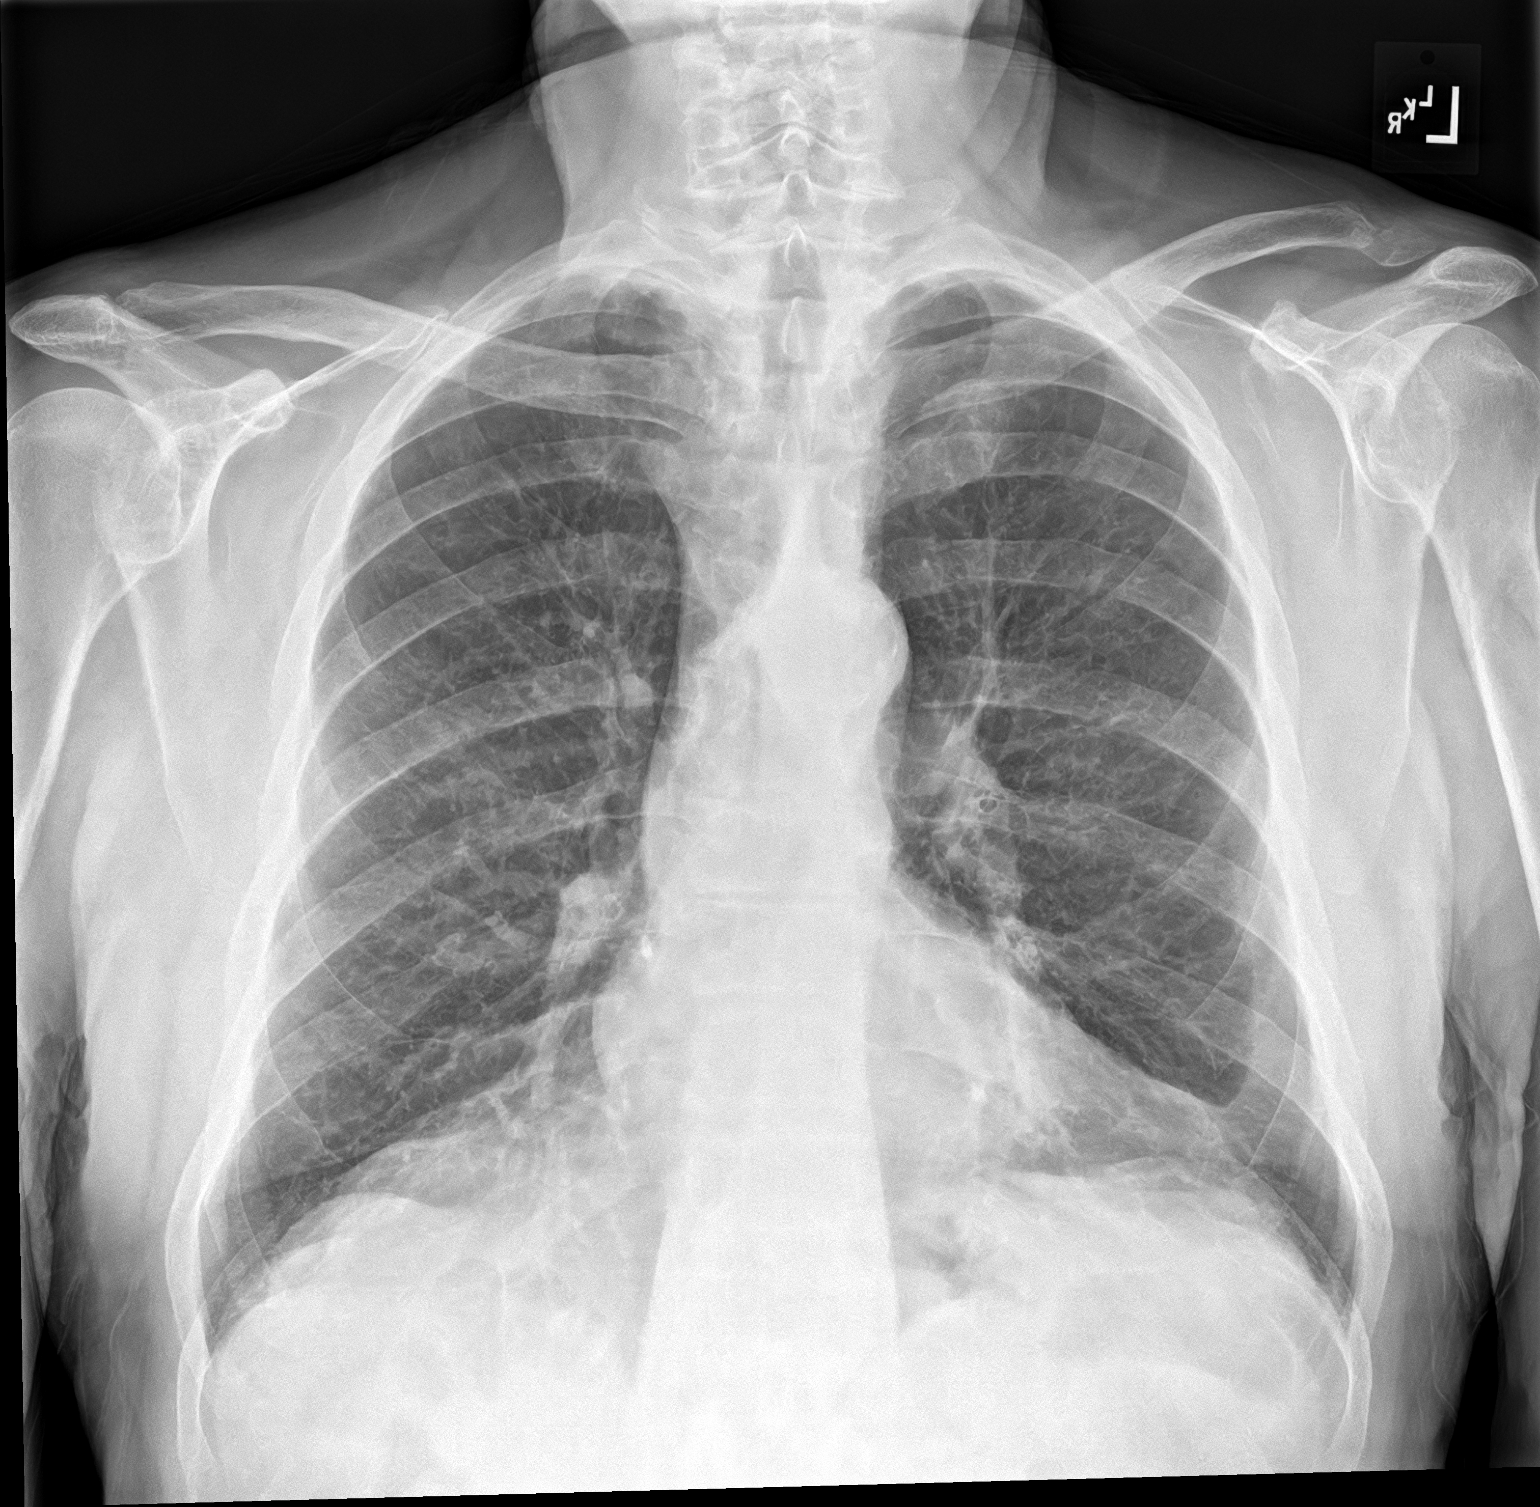

[chest lat]
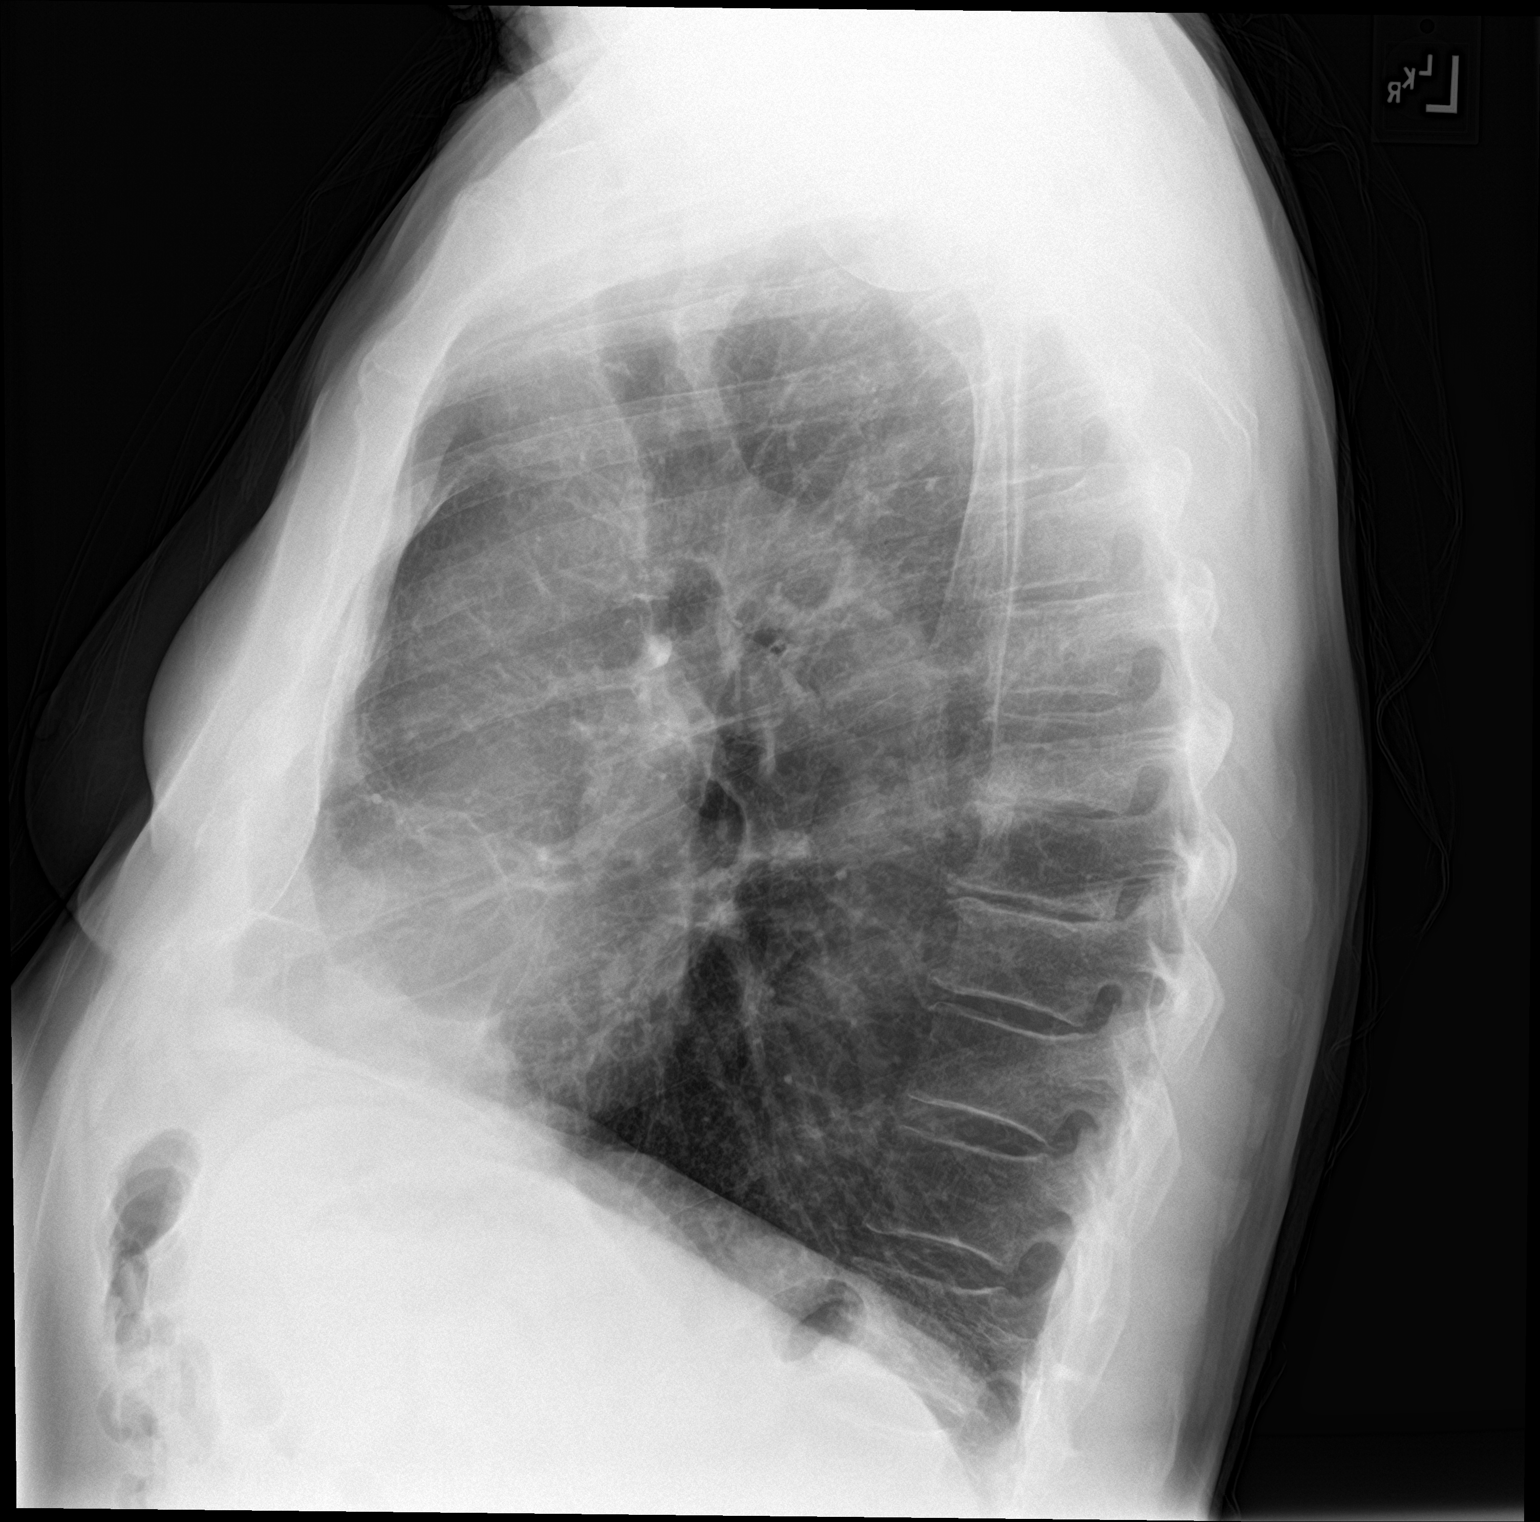

[2 of 2 positions shown; findings below may reference images not displayed]

FINDINGS: There is slight apparent scarring in the lingula. There is no edema
or consolidation. Heart size and pulmonary vascularity are normal.
No adenopathy. There is aortic atherosclerosis. No bone lesions.
IMPRESSION: Mild lingular scarring. No edema or consolidation. There is aortic
atherosclerosis.

## 2018-03-11 ENCOUNTER — Inpatient Hospital Stay
Admission: EM | Admit: 2018-03-11 | Discharge: 2018-03-14 | DRG: 445 | Disposition: A | Discharge status: Home / Self Care | Payer: Medicare Other | Attending: Internal Medicine | Admitting: Internal Medicine

## 2018-03-11 ENCOUNTER — Emergency Department: Payer: Medicare Other

## 2018-03-11 ENCOUNTER — Other Ambulatory Visit: Payer: Self-pay

## 2018-03-11 ENCOUNTER — Encounter: Payer: Self-pay | Admitting: Radiology

## 2018-03-11 DIAGNOSIS — K810 Acute cholecystitis: Principal | ICD-10-CM | POA: Diagnosis present

## 2018-03-11 DIAGNOSIS — F17200 Nicotine dependence, unspecified, uncomplicated: Secondary | ICD-10-CM | POA: Diagnosis present

## 2018-03-11 DIAGNOSIS — K819 Cholecystitis, unspecified: Secondary | ICD-10-CM | POA: Diagnosis not present

## 2018-03-11 DIAGNOSIS — N390 Urinary tract infection, site not specified: Secondary | ICD-10-CM | POA: Diagnosis not present

## 2018-03-11 DIAGNOSIS — I10 Essential (primary) hypertension: Secondary | ICD-10-CM | POA: Diagnosis present

## 2018-03-11 DIAGNOSIS — Z79899 Other long term (current) drug therapy: Secondary | ICD-10-CM

## 2018-03-11 DIAGNOSIS — J449 Chronic obstructive pulmonary disease, unspecified: Secondary | ICD-10-CM | POA: Diagnosis present

## 2018-03-11 DIAGNOSIS — R0602 Shortness of breath: Secondary | ICD-10-CM

## 2018-03-11 DIAGNOSIS — I712 Thoracic aortic aneurysm, without rupture: Secondary | ICD-10-CM | POA: Diagnosis present

## 2018-03-11 LAB — BASIC METABOLIC PANEL
Anion gap: 7 (ref 5–15)
BUN: 18 mg/dL (ref 8–23)
CALCIUM: 9.4 mg/dL (ref 8.9–10.3)
CO2: 27 mmol/L (ref 22–32)
CREATININE: 1.14 mg/dL (ref 0.61–1.24)
Chloride: 105 mmol/L (ref 98–111)
GFR calc Af Amer: >60 mL/min (ref >60)
GFR calc non Af Amer: >60 mL/min (ref >60)
Glucose, Bld: 115 mg/dL — ABNORMAL HIGH (ref 70–99)
Potassium: 4.4 mmol/L (ref 3.5–5.1)
SODIUM: 139 mmol/L (ref 135–145)

## 2018-03-11 LAB — CBC
HCT: 40.9 % (ref 40.0–52.0)
Hemoglobin: 13.7 g/dL (ref 13.0–18.0)
MCH: 29.5 pg (ref 26.0–34.0)
MCHC: 33.6 g/dL (ref 32.0–36.0)
MCV: 87.6 fL (ref 80.0–100.0)
PLATELETS: 337 10*3/uL (ref 150–440)
RBC: 4.66 MIL/uL (ref 4.40–5.90)
RDW: 14.8 % — AB (ref 11.5–14.5)
WBC: 7 10*3/uL (ref 3.8–10.6)

## 2018-03-11 LAB — TROPONIN I

## 2018-03-11 MED ORDER — MORPHINE SULFATE (PF) 4 MG/ML IV SOLN
4.0000 mg | Freq: Once | INTRAVENOUS | Status: AC
  Administered 2018-03-11: 4 mg via INTRAVENOUS
  Filled 2018-03-11: qty 1

## 2018-03-11 MED ORDER — METOCLOPRAMIDE HCL 5 MG/ML IJ SOLN
10.0000 mg | Freq: Once | INTRAMUSCULAR | Status: AC
  Administered 2018-03-11: 10 mg via INTRAVENOUS

## 2018-03-11 MED ORDER — ONDANSETRON HCL 4 MG/2ML IJ SOLN
4.0000 mg | Freq: Once | INTRAMUSCULAR | Status: AC
  Administered 2018-03-11: 4 mg via INTRAVENOUS
  Filled 2018-03-11: qty 2

## 2018-03-11 MED ORDER — METHYLPREDNISOLONE SODIUM SUCC 125 MG IJ SOLR
125.0000 mg | Freq: Once | INTRAMUSCULAR | Status: AC
  Administered 2018-03-11: 125 mg via INTRAVENOUS
  Filled 2018-03-11: qty 2

## 2018-03-11 MED ORDER — METOCLOPRAMIDE HCL 5 MG/ML IJ SOLN
INTRAMUSCULAR | Status: AC
  Administered 2018-03-11: 10 mg via INTRAVENOUS
  Filled 2018-03-11: qty 2

## 2018-03-11 MED ORDER — IOHEXOL 350 MG/ML SOLN
100.0000 mL | Freq: Once | INTRAVENOUS | Status: AC | PRN
  Administered 2018-03-11: 75 mL via INTRAVENOUS

## 2018-03-11 MED ORDER — HYDROMORPHONE HCL 1 MG/ML IJ SOLN
1.0000 mg | Freq: Once | INTRAMUSCULAR | Status: AC
  Administered 2018-03-11: 1 mg via INTRAVENOUS
  Filled 2018-03-11: qty 1

## 2018-03-11 MED ORDER — IPRATROPIUM-ALBUTEROL 0.5-2.5 (3) MG/3ML IN SOLN
3.0000 mL | Freq: Once | RESPIRATORY_TRACT | Status: AC
  Administered 2018-03-11: 3 mL via RESPIRATORY_TRACT
  Filled 2018-03-11: qty 3

## 2018-03-11 NOTE — ED Notes (Signed)
Wife and son are leaving as pt is going to be admitted; stated after the pain medication he has nodded off to sleep;

## 2018-03-11 NOTE — ED Provider Notes (Signed)
[facility] Emergency Department Provider Note ____________________________________________   First MD Initiated Contact with Patient 03/11/18 2159     (approximate)  I have reviewed the triage vital signs and the nursing notes.   HISTORY  Chief Complaint Chest Pain and Shortness of Breath    HPI Ross Montgomery is a 77 y.o. male with PMH as noted below who presents with shortness of breath, gradual onset since yesterday, worsening today, and associated with tightness and discomfort to the chest as well as his shoulders and into his back.  Patient reports associated nonproductive cough, as well as nausea.  He states it is very difficult to breathe.  Past Medical History:  Diagnosis Date  . COPD (chronic obstructive pulmonary disease) (HCC)   . Hypertension     Patient Active Problem List   Diagnosis Date Noted  . Cellulitis 01/14/2017    No past surgical history on file.  Prior to Admission medications   Medication Sig Start Date End Date Taking? Authorizing Provider  albuterol (PROVENTIL HFA;VENTOLIN HFA) 108 (90 Base) MCG/ACT inhaler Inhale 4-6 puffs by mouth every 4 hours as needed for wheezing, cough, and/or shortness of breath 02/19/16   Loleta Rose, MD  doxycycline (VIBRA-TABS) 100 MG tablet Take 1 tablet (100 mg total) by mouth every 12 (twelve) hours. 01/17/17   Alford Highland, MD  predniSONE (DELTASONE) 5 MG tablet 3 tabs po day 1; 2 tabs po day2; 1 tab po day3 01/17/17   Alford Highland, MD  tiotropium (SPIRIVA) 18 MCG inhalation capsule Place 1 capsule (18 mcg total) into inhaler and inhale daily. 01/18/17   Alford Highland, MD    Allergies Patient has no known allergies.  Family History  Problem Relation Age of Onset  . Heart disease Mother   . Heart disease Father   . Non-Hodgkin's lymphoma Sister   . Non-Hodgkin's lymphoma Brother     Social History Social History   Tobacco Use  . Smoking status: Former Smoker    Types:  Cigarettes  . Smokeless tobacco: Never Used  Substance Use Topics  . Alcohol use: No  . Drug use: No    Review of Systems  Constitutional: No fever. Eyes: No redness. ENT: No sore throat. Cardiovascular: Positive for chest discomfort. Respiratory: Positive for shortness of breath. Gastrointestinal: Positive for nausea. Genitourinary: Negative for flank pain.  Musculoskeletal: Positive for back pain. Skin: Negative for rash. Neurological: Negative for headache.   ____________________________________________   PHYSICAL EXAM:  VITAL SIGNS: ED Triage Vitals  Enc Vitals Group     BP 03/11/18 2141 (!) 186/153     Pulse Rate 03/11/18 2140 78     Resp 03/11/18 2140 (!) 24     Temp 03/11/18 2140 97.7 F (36.5 C)     Temp Source 03/11/18 2140 Oral     SpO2 03/11/18 2141 97 %     Weight 03/11/18 2130 165 lb (74.8 kg)     Height 03/11/18 2130 5\' 8"  (1.727 m)     Head Circumference --      Peak Flow --      Pain Score 03/11/18 2129 8     Pain Loc --      Pain Edu? --      Excl. in GC? --     Constitutional: Alert and oriented.  Uncomfortable appearing, and in some distress. Eyes: Conjunctivae are normal.  Head: Atraumatic. Nose: No congestion/rhinnorhea. Mouth/Throat: Mucous membranes are somewhat dry.   Neck: Normal range of motion.  Cardiovascular: Normal rate, regular rhythm. Grossly normal heart sounds.  Good peripheral circulation. Respiratory: Normal respiratory effort.  No retractions.  Significantly decreased breath sounds bilaterally with poor air entry and no audible wheezes. Gastrointestinal: Soft and nontender. No distention.  Genitourinary: No flank tenderness. Musculoskeletal: No lower extremity edema.  Extremities warm and well perfused.  Neurologic:  Normal speech and language. No gross focal neurologic deficits are appreciated.  Skin:  Skin is warm and dry. No rash noted. Psychiatric: Anxious appearing.  ____________________________________________     LABS (all labs ordered are listed, but only abnormal results are displayed)  Labs Reviewed  BASIC METABOLIC PANEL - Abnormal; Notable for the following components:      Result Value   Glucose, Bld 115 (*)    All other components within normal limits  CBC - Abnormal; Notable for the following components:   RDW 14.8 (*)    All other components within normal limits  TROPONIN I   ____________________________________________  EKG  ED ECG REPORT I, Dionne Bucy, the attending physician, personally viewed and interpreted this ECG.  Date: 03/11/2018 EKG Time: 2134 Rate: 88 Rhythm: normal sinus rhythm QRS Axis: normal Intervals: normal ST/T Wave abnormalities: Q waves anteriorly Narrative Interpretation: no evidence of acute ischemia; no significant change when compared to EKG of 01/18/2017  ____________________________________________  RADIOLOGY  CXR: No focal infiltrate CT angio chest: Thickened gallbladder wall and pericholecystic fluid consistent with acute cholecystitis  ____________________________________________   PROCEDURES  Procedure(s) performed: No  Procedures  Critical Care performed: Yes  CRITICAL CARE Performed by: Dionne Bucy   Total critical care time: 30 minutes  Critical care time was exclusive of separately billable procedures and treating other patients.  Critical care was necessary to treat or prevent imminent or life-threatening deterioration.  Critical care was time spent personally by me on the following activities: development of treatment plan with patient and/or surrogate as well as nursing, discussions with consultants, evaluation of patient's response to treatment, examination of patient, obtaining history from patient or surrogate, ordering and performing treatments and interventions, ordering and review of laboratory studies, ordering and review of radiographic studies, pulse oximetry and re-evaluation of patient's  condition. ____________________________________________   INITIAL IMPRESSION / ASSESSMENT AND PLAN / ED COURSE  Pertinent labs & imaging results that were available during my care of the patient were reviewed by me and considered in my medical decision making (see chart for details).  77 year old male with PMH of COPD and hypertension presents with gradual onset worsening shortness of breath and chest, shoulder, and back discomfort since yesterday.  I reviewed the past medical records in Epic; the patient has no recent visits for similar complaints, and was last admitted last year for cellulitis.  On exam, the patient is extremely uncomfortable appearing, sitting up in the bed and has increased work of breathing.  However his O2 saturation is in the high 90s on room air and his other vital signs are normal.  He has decreased breath sounds bilaterally.  Overall I suspect the most likely etiology is COPD exacerbation, however the differential also includes ACS, GERD, or musculoskeletal pain.  Based on the patient's severe pain, and the radiation to the shoulders and back, I am also concerned for aortic dissection.  Given the lack of hypoxia or tachycardia as well as the lack of leg swelling or DVT risk factors, I do not suspect PE at this time.  Plan: Analgesia, nebulizer, steroid, chest x-ray, lab work-up, and CT angiogram to rule out dissection.  -----------------------------------------  11:51 PM on 03/11/2018 -----------------------------------------  Lab work-up is unremarkable.  CT shows no evidence of aortic dissection, although there is an a sending ascending aortic aneurysm with no evidence of acute complication.  However, the CT shows findings consistent with acute cholecystitis.  This time the patient still has relatively severe nausea and vomiting, and is too uncomfortable to go to ultrasound although I have ordered one to be done as soon as he is comfortable enough.  I consulted  Dr. Excell Seltzerooper from general surgery who recommended that we admit to the hospitalist, obtain the ultrasound, and that he will evaluate the patient for surgery tomorrow morning.  I explained the results of the work-up and the plan of care to the patient and his family members, and they expressed agreement.  I signed the patient out to the hospitalist Dr. Imogene Burnhen.  ____________________________________________   FINAL CLINICAL IMPRESSION(S) / ED DIAGNOSES  Final diagnoses:  Cholecystitis  Shortness of breath      NEW MEDICATIONS STARTED DURING THIS VISIT:  New Prescriptions   No medications on file     Note:  This document was prepared using Dragon voice recognition software and may include unintentional dictation errors.    Dionne BucySiadecki, Frimy Uffelman, MD 03/11/18 [phone]

## 2018-03-11 NOTE — ED Notes (Signed)
Pt to CT scan at this time.

## 2018-03-11 NOTE — ED Triage Notes (Addendum)
Reports symptoms began yesterday, worse today.  Chest tightness, short of breath and pain across tops of shoulders.  Lungs clear, diminished in bases.

## 2018-03-12 ENCOUNTER — Inpatient Hospital Stay: Payer: Medicare Other

## 2018-03-12 DIAGNOSIS — F17200 Nicotine dependence, unspecified, uncomplicated: Secondary | ICD-10-CM | POA: Diagnosis present

## 2018-03-12 DIAGNOSIS — K810 Acute cholecystitis: Principal | ICD-10-CM

## 2018-03-12 DIAGNOSIS — Z79899 Other long term (current) drug therapy: Secondary | ICD-10-CM | POA: Diagnosis not present

## 2018-03-12 DIAGNOSIS — N390 Urinary tract infection, site not specified: Secondary | ICD-10-CM | POA: Diagnosis not present

## 2018-03-12 DIAGNOSIS — R0602 Shortness of breath: Secondary | ICD-10-CM

## 2018-03-12 DIAGNOSIS — K819 Cholecystitis, unspecified: Secondary | ICD-10-CM

## 2018-03-12 DIAGNOSIS — I712 Thoracic aortic aneurysm, without rupture: Secondary | ICD-10-CM | POA: Diagnosis present

## 2018-03-12 DIAGNOSIS — J449 Chronic obstructive pulmonary disease, unspecified: Secondary | ICD-10-CM | POA: Diagnosis present

## 2018-03-12 DIAGNOSIS — I10 Essential (primary) hypertension: Secondary | ICD-10-CM | POA: Diagnosis present

## 2018-03-12 HISTORY — PX: IR PERC CHOLECYSTOSTOMY: IMG2326

## 2018-03-12 LAB — URINALYSIS, ROUTINE W REFLEX MICROSCOPIC
Bilirubin Urine: NEGATIVE
GLUCOSE, UA: NEGATIVE mg/dL
Ketones, ur: 20 mg/dL — AB
Nitrite: POSITIVE — AB
PH: 5 (ref 5.0–8.0)
PROTEIN: NEGATIVE mg/dL
Specific Gravity, Urine: 1.028 (ref 1.005–1.030)

## 2018-03-12 LAB — CBC
HCT: 40.3 % (ref 40.0–52.0)
Hemoglobin: 13.6 g/dL (ref 13.0–18.0)
MCH: 30 pg (ref 26.0–34.0)
MCHC: 33.8 g/dL (ref 32.0–36.0)
MCV: 88.6 fL (ref 80.0–100.0)
PLATELETS: 319 10*3/uL (ref 150–440)
RBC: 4.55 MIL/uL (ref 4.40–5.90)
RDW: 15 % — ABNORMAL HIGH (ref 11.5–14.5)
WBC: 15.1 10*3/uL — ABNORMAL HIGH (ref 3.8–10.6)

## 2018-03-12 LAB — HEPATIC FUNCTION PANEL
ALT: 21 U/L (ref 0–44)
AST: 30 U/L (ref 15–41)
Albumin: 4.1 g/dL (ref 3.5–5.0)
Alkaline Phosphatase: 119 U/L (ref 38–126)
BILIRUBIN DIRECT: 0.2 mg/dL (ref 0.0–0.2)
BILIRUBIN INDIRECT: 0.4 mg/dL (ref 0.3–0.9)
Total Bilirubin: 0.6 mg/dL (ref 0.3–1.2)
Total Protein: 7.8 g/dL (ref 6.5–8.1)

## 2018-03-12 LAB — BASIC METABOLIC PANEL
Anion gap: 12 (ref 5–15)
BUN: 16 mg/dL (ref 8–23)
CALCIUM: 9.5 mg/dL (ref 8.9–10.3)
CO2: 26 mmol/L (ref 22–32)
CREATININE: 1.09 mg/dL (ref 0.61–1.24)
Chloride: 103 mmol/L (ref 98–111)
GFR calc Af Amer: >60 mL/min (ref >60)
GFR calc non Af Amer: >60 mL/min (ref >60)
GLUCOSE: 153 mg/dL — AB (ref 70–99)
Potassium: 3.9 mmol/L (ref 3.5–5.1)
Sodium: 141 mmol/L (ref 135–145)

## 2018-03-12 MED ORDER — MIDAZOLAM HCL 5 MG/5ML IJ SOLN
INTRAMUSCULAR | Status: AC
  Filled 2018-03-12: qty 5

## 2018-03-12 MED ORDER — KETOROLAC TROMETHAMINE 15 MG/ML IJ SOLN
15.0000 mg | Freq: Four times a day (QID) | INTRAMUSCULAR | Status: DC | PRN
  Administered 2018-03-12 (×2): 15 mg via INTRAVENOUS
  Filled 2018-03-12 (×2): qty 1

## 2018-03-12 MED ORDER — ONDANSETRON HCL 4 MG PO TABS: 4.0000 mg | ORAL_TABLET | Freq: Four times a day (QID) | ORAL | Status: DC | PRN

## 2018-03-12 MED ORDER — ACETAMINOPHEN 650 MG RE SUPP: 650.0000 mg | Freq: Four times a day (QID) | RECTAL | Status: DC | PRN

## 2018-03-12 MED ORDER — FENTANYL CITRATE (PF) 100 MCG/2ML IJ SOLN
INTRAMUSCULAR | Status: AC | PRN
  Administered 2018-03-12: 50 ug via INTRAVENOUS

## 2018-03-12 MED ORDER — MIDAZOLAM HCL 5 MG/5ML IJ SOLN
INTRAMUSCULAR | Status: AC | PRN
  Administered 2018-03-12: 1 mg via INTRAVENOUS

## 2018-03-12 MED ORDER — SODIUM CHLORIDE 0.9% FLUSH
5.0000 mL | Freq: Three times a day (TID) | INTRAVENOUS | Status: DC
  Administered 2018-03-12 – 2018-03-14 (×7): 5 mL

## 2018-03-12 MED ORDER — MOMETASONE FURO-FORMOTEROL FUM 200-5 MCG/ACT IN AERO
2.0000 | INHALATION_SPRAY | Freq: Two times a day (BID) | RESPIRATORY_TRACT | Status: DC
  Administered 2018-03-12 – 2018-03-14 (×4): 2 via RESPIRATORY_TRACT
  Filled 2018-03-12: qty 8.8

## 2018-03-12 MED ORDER — ACETAMINOPHEN 325 MG PO TABS
650.0000 mg | ORAL_TABLET | Freq: Four times a day (QID) | ORAL | Status: DC | PRN
  Administered 2018-03-12 – 2018-03-13 (×2): 650 mg via ORAL
  Filled 2018-03-12 (×2): qty 2

## 2018-03-12 MED ORDER — IOPAMIDOL (ISOVUE-300) INJECTION 61%
30.0000 mL | Freq: Once | INTRAVENOUS | Status: AC | PRN
  Administered 2018-03-12: 10 mL via INTRAVENOUS

## 2018-03-12 MED ORDER — SODIUM CHLORIDE 0.9 % IV SOLN
1.0000 g | INTRAVENOUS | Status: DC
  Administered 2018-03-12 – 2018-03-13 (×2): 1 g via INTRAVENOUS
  Filled 2018-03-12: qty 1
  Filled 2018-03-12 (×2): qty 10

## 2018-03-12 MED ORDER — CEFAZOLIN SODIUM-DEXTROSE 1-4 GM/50ML-% IV SOLN
INTRAVENOUS | Status: AC | PRN
  Administered 2018-03-12: 2 g via INTRAVENOUS

## 2018-03-12 MED ORDER — NICOTINE 14 MG/24HR TD PT24
14.0000 mg | MEDICATED_PATCH | Freq: Every day | TRANSDERMAL | Status: DC
  Administered 2018-03-12 – 2018-03-13 (×2): 14 mg via TRANSDERMAL
  Filled 2018-03-12 (×3): qty 1

## 2018-03-12 MED ORDER — ALBUTEROL SULFATE (2.5 MG/3ML) 0.083% IN NEBU: 2.5000 mg | INHALATION_SOLUTION | RESPIRATORY_TRACT | Status: DC | PRN

## 2018-03-12 MED ORDER — HYDROCODONE-ACETAMINOPHEN 5-325 MG PO TABS
1.0000 | ORAL_TABLET | ORAL | Status: DC | PRN
  Administered 2018-03-12: 2 via ORAL
  Filled 2018-03-12 (×2): qty 2

## 2018-03-12 MED ORDER — POTASSIUM CHLORIDE IN NACL 20-0.9 MEQ/L-% IV SOLN
INTRAVENOUS | Status: DC
  Administered 2018-03-12 – 2018-03-13 (×3): via INTRAVENOUS
  Filled 2018-03-12 (×4): qty 1000

## 2018-03-12 MED ORDER — ENOXAPARIN SODIUM 40 MG/0.4ML ~~LOC~~ SOLN
40.0000 mg | SUBCUTANEOUS | Status: DC
  Administered 2018-03-12 – 2018-03-14 (×3): 40 mg via SUBCUTANEOUS
  Filled 2018-03-12 (×3): qty 0.4

## 2018-03-12 MED ORDER — SODIUM CHLORIDE 0.9 % IV SOLN
INTRAVENOUS | Status: AC | PRN
  Administered 2018-03-12: 10 mL/h via INTRAVENOUS

## 2018-03-12 MED ORDER — SENNOSIDES-DOCUSATE SODIUM 8.6-50 MG PO TABS: 1.0000 | ORAL_TABLET | Freq: Every evening | ORAL | Status: DC | PRN

## 2018-03-12 MED ORDER — FENTANYL CITRATE (PF) 100 MCG/2ML IJ SOLN
INTRAMUSCULAR | Status: AC
Start: 2018-03-12 — End: ?
  Filled 2018-03-12: qty 4

## 2018-03-12 MED ORDER — BUTALBITAL-APAP-CAFFEINE 50-325-40 MG PO TABS
1.0000 | ORAL_TABLET | ORAL | Status: DC | PRN
  Administered 2018-03-12 (×2): 1 via ORAL
  Filled 2018-03-12 (×3): qty 1

## 2018-03-12 MED ORDER — AMLODIPINE BESYLATE 5 MG PO TABS
5.0000 mg | ORAL_TABLET | Freq: Every day | ORAL | Status: DC
  Administered 2018-03-12 – 2018-03-14 (×3): 5 mg via ORAL
  Filled 2018-03-12 (×3): qty 1

## 2018-03-12 MED ORDER — TIOTROPIUM BROMIDE MONOHYDRATE 18 MCG IN CAPS
18.0000 ug | ORAL_CAPSULE | Freq: Every day | RESPIRATORY_TRACT | Status: DC
  Administered 2018-03-13 – 2018-03-14 (×2): 18 ug via RESPIRATORY_TRACT
  Filled 2018-03-12: qty 5

## 2018-03-12 MED ORDER — CEFAZOLIN SODIUM-DEXTROSE 2-4 GM/100ML-% IV SOLN
INTRAVENOUS | Status: AC
  Filled 2018-03-12: qty 100

## 2018-03-12 MED ORDER — ONDANSETRON HCL 4 MG/2ML IJ SOLN
4.0000 mg | Freq: Four times a day (QID) | INTRAMUSCULAR | Status: DC | PRN
  Administered 2018-03-12 (×2): 4 mg via INTRAVENOUS
  Filled 2018-03-12 (×3): qty 2

## 2018-03-12 MED ORDER — BUTALBITAL-APAP-CAFFEINE 50-325-40 MG PO TABS
ORAL_TABLET | ORAL | Status: AC
  Administered 2018-03-12: 1 via ORAL
  Filled 2018-03-12: qty 1

## 2018-03-12 MED ORDER — LIDOCAINE HCL (PF) 1 % IJ SOLN
INTRAMUSCULAR | Status: AC
Start: 2018-03-12 — End: ?
  Filled 2018-03-12: qty 30

## 2018-03-12 MED ORDER — BISACODYL 5 MG PO TBEC: 5.0000 mg | DELAYED_RELEASE_TABLET | Freq: Every day | ORAL | Status: DC | PRN

## 2018-03-12 MED ORDER — MORPHINE SULFATE (PF) 2 MG/ML IV SOLN
2.0000 mg | INTRAVENOUS | Status: DC | PRN
  Filled 2018-03-12: qty 1

## 2018-03-12 NOTE — ED Notes (Addendum)
US tech in to take pt to radiology;

## 2018-03-12 NOTE — Consult Note (Signed)
Surgical Consultation  03/12/2018  Ross Montgomery is an 77 y.o. male.   Referring Physician: ER MD  CC:SOB, CP  HPI: This patient with 3 days of right chest pain and back pain.  Work-up in the emergency room failed to identify acute pulmonary or chest pathology but thickened gallbladder wall was noted on CT scan.  Patient denies any abdominal pain at this time but has been vomiting.  He had an episode like this 6 months ago that spontaneously resolved.  He states he has hypertension but his only medication for it is according to him his aspirin.  He continues to smoke.  He stopped 2 years ago but restarted.  He has had no fevers or chills.  This morning his chief complaint is headache and problems urinating.  He is concerned that he has blood in his urine.  He states that his abdominal pain is mostly gone if it was ever present.  Most of his pain was in his chest and that is much improved.  His back pain is still present.  Past Medical History:  Diagnosis Date  . COPD (chronic obstructive pulmonary disease) ([place])   . Hypertension     History reviewed. No pertinent surgical history.  Family History  Problem Relation Age of Onset  . Heart disease Mother   . Heart disease Father   . Non-Hodgkin's lymphoma Sister   . Non-Hodgkin's lymphoma Brother     Social History:  reports that he has quit smoking. His smoking use included cigarettes. He has never used smokeless tobacco. He reports that he does not drink alcohol or use drugs.  Allergies: No Known Allergies  Medications reviewed.   Review of Systems:   Review of Systems  Constitutional: Negative for chills and fever.  HENT: Negative.   Eyes: Negative.   Respiratory: Positive for cough, shortness of breath and wheezing. Negative for sputum production.   Cardiovascular: Positive for chest pain. Negative for palpitations and orthopnea.  Gastrointestinal: Positive for abdominal pain, nausea and vomiting. Negative for blood in  stool, constipation, diarrhea and heartburn.  Genitourinary: Positive for dysuria, frequency, hematuria and urgency.  Musculoskeletal: Negative.   Skin: Negative.   Neurological: Positive for dizziness and headaches.  Endo/Heme/Allergies: Negative.   Psychiatric/Behavioral: Negative.      Physical Exam:  BP (!) 149/82 (BP Location: Left Arm)   Pulse 90   Temp 98.1 F (36.7 C) (Oral)   Resp 18   Ht 5' 8"  (1.727 m)   Wt 149 lb 9.6 oz (67.9 kg)   SpO2 95%   BMI 22.75 kg/m   Physical Exam  Constitutional: He is oriented to person, place, and time.  Non-toxic appearance. He appears ill. He appears distressed.  Patient prefers to lie on the left side holding his head.  He appears quite uncomfortable.  Vital signs reviewed.  He appears tachypneic and has been intermittently severely hypertensive  HENT:  Head: Normocephalic and atraumatic.  Eyes: EOM are normal.  Cardiovascular: Normal rate and regular rhythm.  Pulmonary/Chest: Effort normal. No accessory muscle usage. Tachypnea noted. No respiratory distress. He has wheezes in the right upper field and the left upper field. He has rhonchi in the right upper field and the left upper field. He has no rales.  Abdominal: Soft. He exhibits distension. He exhibits no mass. There is tenderness. There is no guarding.  Tender in the epigastrium and right upper quadrant which is minimal.  Percell Miller sign is vaguely positive.  Genitourinary:  Genitourinary Comments: Urine seen  in collection container in room does not appear to be grossly hematuric  Neurological: He is alert and oriented to person, place, and time.  Skin: Skin is warm and dry. No erythema.  Psychiatric: His mood appears anxious.  Vitals reviewed.     Results for orders placed or performed during the hospital encounter of 03/11/18 (from the past 48 hour(s))  Basic metabolic panel     Status: Abnormal   Collection Time: 03/11/18  9:37 PM  Result Value Ref Range   Sodium 139  135 - 145 mmol/L   Potassium 4.4 3.5 - 5.1 mmol/L   Chloride 105 98 - 111 mmol/L   CO2 27 22 - 32 mmol/L   Glucose, Bld 115 (H) 70 - 99 mg/dL   BUN 18 8 - 23 mg/dL   Creatinine, Ser 1.14 0.61 - 1.24 mg/dL   Calcium 9.4 8.9 - 10.3 mg/dL   GFR calc non Af Amer >60 >60 mL/min   GFR calc Af Amer >60 >60 mL/min    Comment: (NOTE) The eGFR has been calculated using the CKD EPI equation. This calculation has not been validated in all clinical situations. eGFR's persistently <60 mL/min signify possible Chronic Kidney Disease.    Anion gap 7 5 - 15    Comment: Performed at [facility], [address] [place], [place] [address]  CBC     Status: Abnormal   Collection Time: 03/11/18  9:37 PM  Result Value Ref Range   WBC 7.0 3.8 - 10.6 K/uL   RBC 4.66 4.40 - 5.90 MIL/uL   Hemoglobin 13.7 13.0 - 18.0 g/dL   HCT 40.9 40.0 - 52.0 %   MCV 87.6 80.0 - 100.0 fL   MCH 29.5 26.0 - 34.0 pg   MCHC 33.6 32.0 - 36.0 g/dL   RDW 14.8 (H) 11.5 - 14.5 %   Platelets 337 150 - 440 K/uL    Comment: Performed at [facility], [address] [place], [place] [address]  Troponin I     Status: None   Collection Time: 03/11/18  9:37 PM  Result Value Ref Range   Troponin I <0.03 <0.03 ng/mL    Comment: Performed at [facility], [address] [place], [place] [address]  Basic metabolic panel     Status: Abnormal   Collection Time: 03/12/18  5:09 AM  Result Value Ref Range   Sodium 141 135 - 145 mmol/L   Potassium 3.9 3.5 - 5.1 mmol/L   Chloride 103 98 - 111 mmol/L   CO2 26 22 - 32 mmol/L   Glucose, Bld 153 (H) 70 - 99 mg/dL   BUN 16 8 - 23 mg/dL   Creatinine, Ser 1.09 0.61 - 1.24 mg/dL   Calcium 9.5 8.9 - 10.3 mg/dL   GFR calc non Af Amer >60 >60 mL/min   GFR calc Af Amer >60 >60 mL/min    Comment: (NOTE) The eGFR has been calculated using the CKD EPI equation. This calculation has not been validated in all clinical situations. eGFR's persistently <60 mL/min signify  possible Chronic Kidney Disease.    Anion gap 12 5 - 15    Comment: Performed at [facility], [address] [place], [place] [address]  CBC     Status: Abnormal   Collection Time: 03/12/18  5:09 AM  Result Value Ref Range   WBC 15.1 (H) 3.8 - 10.6 K/uL   RBC 4.55 4.40 - 5.90 MIL/uL   Hemoglobin 13.6 13.0 -  18.0 g/dL   HCT 40.3 40.0 - 52.0 %   MCV 88.6 80.0 - 100.0 fL   MCH 30.0 26.0 - 34.0 pg   MCHC 33.8 32.0 - 36.0 g/dL   RDW 15.0 (H) 11.5 - 14.5 %   Platelets 319 150 - 440 K/uL    Comment: Performed at [facility], [address] [place], [place] [address]  Hepatic function panel     Status: None   Collection Time: 03/12/18  5:09 AM  Result Value Ref Range   Total Protein 7.8 6.5 - 8.1 g/dL   Albumin 4.1 3.5 - 5.0 g/dL   AST 30 15 - 41 U/L   ALT 21 0 - 44 U/L   Alkaline Phosphatase 119 38 - 126 U/L   Total Bilirubin 0.6 0.3 - 1.2 mg/dL   Bilirubin, Direct 0.2 0.0 - 0.2 mg/dL   Indirect Bilirubin 0.4 0.3 - 0.9 mg/dL    Comment: Performed at [facility], [address], [place], [place] [address]   Dg Chest 2 View  Result Date: 03/11/2018 CLINICAL DATA:  77 y/o M; chest tightness and shortness of breath. Vomiting. History of COPD. EXAM: CHEST - 2 VIEW COMPARISON:  03/11/2018 CT chest.  01/14/2017 chest radiograph. FINDINGS: Normal cardiac silhouette. Aortic atherosclerosis with calcification. Stable hyperinflated lungs, emphysema, with flattened diaphragms compatible with COPD. No focal consolidation. No pleural effusion or pneumothorax. No acute osseous abnormality is evident. IMPRESSION: COPD and aortic atherosclerosis. No acute pulmonary process identified. Electronically Signed   By: Kristine Garbe M.D.   On: 03/11/2018 23:14   Ct Angio Chest/abd/pel For Dissection W And/or Wo Contrast  Result Date: 03/11/2018 CLINICAL DATA:  Acute chest and back pain, shortness of breath, vomiting, pain across tops of shoulders, question aortic  dissection; history hypertension, COPD, former smoker EXAM: CT ANGIOGRAPHY CHEST, ABDOMEN AND PELVIS TECHNIQUE: Multidetector CT imaging through the chest, abdomen and pelvis was performed using the standard protocol during bolus administration of intravenous contrast. Multiplanar reconstructed images and MIPs were obtained and reviewed to evaluate the vascular anatomy. CONTRAST:  81m OMNIPAQUE IOHEXOL 350 MG/ML SOLN COMPARISON:  07/05/2007 FINDINGS: CTA CHEST FINDINGS Cardiovascular: Scattered atherosclerotic calcifications of aorta proximal great vessels. Minimal coronary arterial calcification. No intramural hematoma on precontrast imaging. 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 Following contrast administration, normal aortic enhancement is identified without evidence of aortic dissection. Pulmonary arteries adequately opacified and patent. No pericardial effusion. Mediastinum/Nodes: Prominent epicardial fat pad at RIGHT cardiophrenic angle. Esophagus unremarkable. Small hiatal hernia. Base of cervical region normal appearance. No thoracic adenopathy. Lungs/Pleura: Diffuse emphysematous changes. Lungs clear. No pulmonary infiltrate, pleural effusion or pneumothorax. Musculoskeletal: Bones demineralized. No acute abnormalities. Degenerative disc disease changes thoracic spine. Vertebral hemangioma T6 vertebral body with bone island at LEFT pedicle T6. Review of the MIP images confirms the above findings. CTA ABDOMEN AND PELVIS FINDINGS VASCULAR Aorta: Diffuse atherosclerotic changes. Normal caliber. No dissection. Celiac: Minimal calcific plaque at origin.  Widely patent. SMA: Widely patent Renals: Widely patent BILATERAL renal arteries. IMA: Small but patent IMA Inflow: Scattered atherosclerotic calcifications, greatest at common iliac artery bifurcations. Veins: Limited but unremarkable on CTA exam Review of the MIP images confirms the above findings. NON-VASCULAR Hepatobiliary:  Markedly thickened gallbladder wall with pericholecystic infiltration consistent with acute cholecystitis. Fatty infiltration of liver. Pancreas: Normal appearance Spleen: Normal appearance Adrenals/Urinary Tract: Adrenal glands, kidneys, ureters, and bladder normal appearance Stomach/Bowel: Appendix not visualized but no pericecal inflammatory process seen diverticulosis of descending  and sigmoid colon without evidence of diverticulitis. Lymphatic: No adenopathy Reproductive: Prostatic enlargement, gland measuring 4.7 x 4.5 cm image 198 Other: BILATERAL inguinal hernias containing fat. A nonobstructed small bowel loop extends into the RIGHT internal inguinal ring but not down the inguinal canal. Small umbilical hernia containing fat. No free air or free fluid. Musculoskeletal: Bones demineralized. Review of the MIP images confirms the above findings. IMPRESSION: Scattered atherosclerotic changes of the aorta without evidence of aortic dissection. 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 Recommend annual imaging followup by CTA or MRA. This recommendation follows 2010 ACCF/AHA/AATS/ACR/ASA/SCA/SCAI/SIR/STS/SVM Guidelines for the Diagnosis and Management of Patients with Thoracic Aortic Disease. Circulation. 2010; 121: I103-U131 Markedly thickened gallbladder wall with pericholecystic infiltrative changes consistent with acute cholecystitis Prostatic enlargement. BILATERAL inguinal and umbilical hernias as above. Distal colonic diverticulosis without evidence of diverticulitis. Emphysematous changes without infiltrate. Aortic Atherosclerosis (ICD10-I70.0). Aortic aneurysm NOS (ICD10-I71.9). Emphysema (ICD10-J43.9). Electronically Signed   By: Lavonia Dana M.D.   On: 03/11/2018 23:18    Assessment/Plan:  CT scan personally reviewed thickened gallbladder wall Labs personally reviewed as were the vital signs.  I was called from the emergency room  stating that the patient had acute cholecystitis as a source of his chest pain.  While that is likely the source he has fairly uncontrolled and volatile or labile hypertension.  His diastolic has been as high as triple digits multiple times throughout the night.  His respiratory rate is also up and his chief complaint is a headache this morning.  I initially offered since his blood pressure is better controlled this morning laparoscopic cholecystectomy.  He asked for alternatives.  I suggested that if he did not want to have surgery that a cholecystostomy tube could be temporizing until his pulmonary status hypertension and headache could be better treated.  He wishes to consider that and in fact does not want to have surgery at this time.  I described for him cholecystostomy tube with local anesthetic guided by imaging in interventional radiology.  He prefers that method at this time.  We will continue to follow and consider surgical intervention once his medical conditions are better addressed.  Between smoking and hypertension these need to be better controlled.  He states that he only takes aspirin for his known hypertension.  Florene Glen, MD, FACS

## 2018-03-12 NOTE — H&P (Signed)
[employer] - [place] at [facility]   PATIENT NAME: Ross Montgomery    MR#:  [id number]  DATE OF BIRTH:  Aug 14, 1940  DATE OF ADMISSION:  03/11/2018  PRIMARY CARE PHYSICIAN: Patient, No Pcp Per   REQUESTING/REFERRING PHYSICIAN: Dionne Bucy, MD  CHIEF COMPLAINT:   Chief Complaint  Patient presents with  . Chest Pain  . Shortness of Breath   Chest pain or shortness of breath since yesterday. HISTORY OF PRESENT ILLNESS:  [name]  is a 77 y.o. male with a known history of hypertension and COPD.  The patient came to ED due to above chief complaints.  The patient started to have shortness of breath yesterday, which has been worsening today, associated with chest tightness and discomfort, radiation to right shoulder and the back.  He said he has chronic back pain and migraine.  He also complains of headache.  CT angiogram of the chest did not show any PE but did show acute cholecystitis.  ED physician discussed with Dr. Excell Seltzer, who suggested possible surgery tomorrow and admit under medical service.  PAST MEDICAL HISTORY:   Past Medical History:  Diagnosis Date  . COPD (chronic obstructive pulmonary disease) (HCC)   . Hypertension     PAST SURGICAL HISTORY:  No past surgical history on file.  SOCIAL HISTORY:   Social History   Tobacco Use  . Smoking status: Former Smoker    Types: Cigarettes  . Smokeless tobacco: Never Used  Substance Use Topics  . Alcohol use: No    FAMILY HISTORY:   Family History  Problem Relation Age of Onset  . Heart disease Mother   . Heart disease Father   . Non-Hodgkin's lymphoma Sister   . Non-Hodgkin's lymphoma Brother     DRUG ALLERGIES:  No Known Allergies  REVIEW OF SYSTEMS:   Review of Systems  Constitutional: Negative for chills, fever and malaise/fatigue.  HENT: Negative for sore throat.   Eyes: Negative for blurred vision and double vision.  Respiratory: Positive for shortness of breath. Negative  for cough, hemoptysis, wheezing and stridor.   Cardiovascular: Positive for chest pain. Negative for palpitations, orthopnea and leg swelling.  Gastrointestinal: Positive for nausea. Negative for abdominal pain, blood in stool, diarrhea, melena and vomiting.  Genitourinary: Negative for dysuria, flank pain and hematuria.  Musculoskeletal: Negative for back pain and joint pain.  Skin: Negative for rash.  Neurological: Negative for dizziness, sensory change, focal weakness, seizures, loss of consciousness, weakness and headaches.  Endo/Heme/Allergies: Negative for polydipsia.  Psychiatric/Behavioral: Negative for depression. The patient is not nervous/anxious.     MEDICATIONS AT HOME:   Prior to Admission medications   Medication Sig Start Date End Date Taking? Authorizing Provider  albuterol (PROVENTIL HFA;VENTOLIN HFA) 108 (90 Base) MCG/ACT inhaler Inhale 4-6 puffs by mouth every 4 hours as needed for wheezing, cough, and/or shortness of breath 02/19/16   Loleta Rose, MD  doxycycline (VIBRA-TABS) 100 MG tablet Take 1 tablet (100 mg total) by mouth every 12 (twelve) hours. 01/17/17   Alford Highland, MD  predniSONE (DELTASONE) 5 MG tablet 3 tabs po day 1; 2 tabs po day2; 1 tab po day3 01/17/17   Alford Highland, MD  tiotropium (SPIRIVA) 18 MCG inhalation capsule Place 1 capsule (18 mcg total) into inhaler and inhale daily. 01/18/17   Alford Highland, MD      VITAL SIGNS:  Blood pressure (!) 148/82, pulse 64, temperature 97.7 F (36.5 C), temperature source Oral, resp. rate 18, height 5\' 8"  (  1.727 m), weight 165 lb (74.8 kg), SpO2 100 %.  PHYSICAL EXAMINATION:  Physical Exam  GENERAL:  77 y.o.-year-old patient lying in the bed with no acute distress.  EYES: Pupils equal, round, reactive to light and accommodation. No scleral icterus. Extraocular muscles intact.  HEENT: Head atraumatic, normocephalic. Oropharynx and nasopharynx clear.  NECK:  Supple, no jugular venous distention. No  thyroid enlargement, no tenderness.  LUNGS: Normal breath sounds bilaterally, no wheezing, rales,rhonchi or crepitation. No use of accessory muscles of respiration.  CARDIOVASCULAR: S1, S2 normal. No murmurs, rubs, or gallops.  ABDOMEN: Soft, nontender, nondistended. Bowel sounds present. No organomegaly or mass.  EXTREMITIES: No pedal edema, cyanosis, or clubbing.  NEUROLOGIC: Cranial nerves II through XII are intact. Muscle strength 5/5 in all extremities. Sensation intact. Gait not checked.  PSYCHIATRIC: The patient is alert and oriented x 3.  SKIN: No obvious rash, lesion, or ulcer.   LABORATORY PANEL:   CBC Recent Labs  Lab 03/11/18 2137  WBC 7.0  HGB 13.7  HCT 40.9  PLT 337   ------------------------------------------------------------------------------------------------------------------  Chemistries  Recent Labs  Lab 03/11/18 2137  NA 139  K 4.4  CL 105  CO2 27  GLUCOSE 115*  BUN 18  CREATININE 1.14  CALCIUM 9.4   ------------------------------------------------------------------------------------------------------------------  Cardiac Enzymes Recent Labs  Lab 03/11/18 2137  TROPONINI <0.03   ------------------------------------------------------------------------------------------------------------------  RADIOLOGY:  Dg Chest 2 View  Result Date: 03/11/2018 CLINICAL DATA:  77 y/o M; chest tightness and shortness of breath. Vomiting. History of COPD. EXAM: CHEST - 2 VIEW COMPARISON:  03/11/2018 CT chest.  01/14/2017 chest radiograph. FINDINGS: Normal cardiac silhouette. Aortic atherosclerosis with calcification. Stable hyperinflated lungs, emphysema, with flattened diaphragms compatible with COPD. No focal consolidation. No pleural effusion or pneumothorax. No acute osseous abnormality is evident. IMPRESSION: COPD and aortic atherosclerosis. No acute pulmonary process identified. Electronically Signed   By: Mitzi HansenLance  Furusawa-Stratton M.D.   On: 03/11/2018 23:14    Ct Angio Chest/abd/pel For Dissection W And/or Wo Contrast  Result Date: 03/11/2018 CLINICAL DATA:  Acute chest and back pain, shortness of breath, vomiting, pain across tops of shoulders, question aortic dissection; history hypertension, COPD, former smoker EXAM: CT ANGIOGRAPHY CHEST, ABDOMEN AND PELVIS TECHNIQUE: Multidetector CT imaging through the chest, abdomen and pelvis was performed using the standard protocol during bolus administration of intravenous contrast. Multiplanar reconstructed images and MIPs were obtained and reviewed to evaluate the vascular anatomy. CONTRAST:  75mL OMNIPAQUE IOHEXOL 350 MG/ML SOLN COMPARISON:  07/05/2007 FINDINGS: CTA CHEST FINDINGS Cardiovascular: Scattered atherosclerotic calcifications of aorta proximal great vessels. Minimal coronary arterial calcification. No intramural hematoma on precontrast imaging. 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 Following contrast administration, normal aortic enhancement is identified without evidence of aortic dissection. Pulmonary arteries adequately opacified and patent. No pericardial effusion. Mediastinum/Nodes: Prominent epicardial fat pad at RIGHT cardiophrenic angle. Esophagus unremarkable. Small hiatal hernia. Base of cervical region normal appearance. No thoracic adenopathy. Lungs/Pleura: Diffuse emphysematous changes. Lungs clear. No pulmonary infiltrate, pleural effusion or pneumothorax. Musculoskeletal: Bones demineralized. No acute abnormalities. Degenerative disc disease changes thoracic spine. Vertebral hemangioma T6 vertebral body with bone island at LEFT pedicle T6. Review of the MIP images confirms the above findings. CTA ABDOMEN AND PELVIS FINDINGS VASCULAR Aorta: Diffuse atherosclerotic changes. Normal caliber. No dissection. Celiac: Minimal calcific plaque at origin.  Widely patent. SMA: Widely patent Renals: Widely patent BILATERAL renal arteries. IMA: Small but patent IMA Inflow:  Scattered atherosclerotic calcifications, greatest at common iliac artery bifurcations. Veins: Limited  but unremarkable on CTA exam Review of the MIP images confirms the above findings. NON-VASCULAR Hepatobiliary: Markedly thickened gallbladder wall with pericholecystic infiltration consistent with acute cholecystitis. Fatty infiltration of liver. Pancreas: Normal appearance Spleen: Normal appearance Adrenals/Urinary Tract: Adrenal glands, kidneys, ureters, and bladder normal appearance Stomach/Bowel: Appendix not visualized but no pericecal inflammatory process seen diverticulosis of descending and sigmoid colon without evidence of diverticulitis. Lymphatic: No adenopathy Reproductive: Prostatic enlargement, gland measuring 4.7 x 4.5 cm image 198 Other: BILATERAL inguinal hernias containing fat. A nonobstructed small bowel loop extends into the RIGHT internal inguinal ring but not down the inguinal canal. Small umbilical hernia containing fat. No free air or free fluid. Musculoskeletal: Bones demineralized. Review of the MIP images confirms the above findings. IMPRESSION: Scattered atherosclerotic changes of the aorta without evidence of aortic dissection. 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 Recommend annual imaging followup by CTA or MRA. This recommendation follows 2010 ACCF/AHA/AATS/ACR/ASA/SCA/SCAI/SIR/STS/SVM Guidelines for the Diagnosis and Management of Patients with Thoracic Aortic Disease. Circulation. 2010; 121: U045-W098 Markedly thickened gallbladder wall with pericholecystic infiltrative changes consistent with acute cholecystitis Prostatic enlargement. BILATERAL inguinal and umbilical hernias as above. Distal colonic diverticulosis without evidence of diverticulitis. Emphysematous changes without infiltrate. Aortic Atherosclerosis (ICD10-I70.0). Aortic aneurysm NOS (ICD10-I71.9). Emphysema (ICD10-J43.9). Electronically Signed   By: Ulyses Southward M.D.   On: 03/11/2018 23:18      IMPRESSION AND PLAN:   Acute cholecystitis. The patient will be admitted to medical floor under medical service. Start Rocephin IV, pain control, Zofran as needed, follow-up Dr. Excell Seltzer for surgery.  Hypertension.  Continue home hypertension medication.  COPD.  Stable.  Nebulizer as needed.  Tobacco abuse.  Smoking cessation was counseled for 4 minutes, nicotine patch.  All the records are reviewed and case discussed with ED provider. Management plans discussed with the patient, family and they are in agreement.  CODE STATUS: Full code  TOTAL TIME TAKING CARE OF THIS PATIENT: 38 minutes.    Shaune Pollack M.D on 03/12/2018 at 12:14 AM  Between 7am to 6pm - Pager - [phone]  After 6pm go to [email or web address] - Social research officer, government  [facility] [place] [facility]  Office  [phone]  CC: Primary care physician; Patient, No Pcp Per   Note: This dictation was prepared with Dragon dictation along with smaller phrase technology. Any transcriptional errors that result from this process are unin

## 2018-03-12 NOTE — Procedures (Signed)
Interventional Radiology Procedure Note  Procedure: Placement of a 3F percutaneous cholecystostomy tube.  Frankly purulent bile, sample sent for culture.   Complications: None  Estimated Blood Loss: None  Recommendations: - Chole tube to bag drainage - Interval elective cholecystostomy when patient able - Tube check and exchange in 6 weeks   Signed,  Sterling BigHeath K. McCullough, MD

## 2018-03-12 NOTE — ED Notes (Signed)
Pt's door noted to be closed; in to check room and pt is still there, did not go to US: pt says he has a headache and he's too dizzy to be moved; spoke with admitting MD and order for Fioricet has been placed;

## 2018-03-12 NOTE — Progress Notes (Signed)
Patient off the floor to IR at this time

## 2018-03-12 NOTE — Progress Notes (Signed)
Pt complaining of frequent urination along with a burning sensation while urinating. Primary nurse paged and spoke to Dr. Suzi RootsSridhan. Orders received for Urinalysis. Primary nurse to continue to monitor.

## 2018-03-12 NOTE — Consult Note (Signed)
Chief Complaint: Patient was seen in consultation today for  Chief Complaint  Patient presents with  . Chest Pain  . Shortness of Breath   at the request of * Dr. Dionne Miloichard Cooper*  Referring Physician(s): Antoine Poche\ichard Cooper, MD  [facility]  History of Present Illness: Ross InfanteRalph Wiegman is a 77 y.o. male currently admitted for evaluation of chest and back pain.  Work up reveals cholelithiasis and severe inflammatory changes surrounding the gallbladder.  He has had some nausea.  He denies abdominal pain.   General surgery was consulted and was prepared to offer cholecystectomy but the patient declined due to headache and subjective SOB.    IR now consulted to evaluate for percutaneous cholecystostomy tube placement.  CT imaging reviewed, the inflammatory changes are impressive.  Patient would benefit from drainage.     Past Medical History:  Diagnosis Date  . COPD (chronic obstructive pulmonary disease) (HCC)   . Hypertension     History reviewed. No pertinent surgical history.  Allergies: Patient has no known allergies.  Medications: Prior to Admission medications   Medication Sig Start Date End Date Taking? Authorizing Provider  albuterol (PROVENTIL HFA;VENTOLIN HFA) 108 (90 Base) MCG/ACT inhaler Inhale 4-6 puffs by mouth every 4 hours as needed for wheezing, cough, and/or shortness of breath 02/19/16   Loleta RoseForbach, Cory, MD  doxycycline (VIBRA-TABS) 100 MG tablet Take 1 tablet (100 mg total) by mouth every 12 (twelve) hours. 01/17/17   Alford HighlandWieting, Richard, MD  predniSONE (DELTASONE) 5 MG tablet 3 tabs po day 1; 2 tabs po day2; 1 tab po day3 01/17/17   Alford HighlandWieting, Richard, MD  tiotropium (SPIRIVA) 18 MCG inhalation capsule Place 1 capsule (18 mcg total) into inhaler and inhale daily. 01/18/17   Alford HighlandWieting, Richard, MD     Family History  Problem Relation Age of Onset  . Heart disease Mother   . Heart disease Father   . Non-Hodgkin's lymphoma Sister   . Non-Hodgkin's  lymphoma Brother     Social History   Socioeconomic History  . Marital status: Married    Spouse name: Not on file  . Number of children: Not on file  . Years of education: Not on file  . Highest education level: Not on file  Occupational History  . Not on file  Social Needs  . Financial resource strain: Not on file  . Food insecurity:    Worry: Not on file    Inability: Not on file  . Transportation needs:    Medical: Not on file    Non-medical: Not on file  Tobacco Use  . Smoking status: Former Smoker    Types: Cigarettes  . Smokeless tobacco: Never Used  Substance and Sexual Activity  . Alcohol use: No  . Drug use: No  . Sexual activity: Yes  Lifestyle  . Physical activity:    Days per week: Not on file    Minutes per session: Not on file  . Stress: Not on file  Relationships  . Social connections:    Talks on phone: Not on file    Gets together: Not on file    Attends religious service: Not on file    Active member of club or organization: Not on file    Attends meetings of clubs or organizations: Not on file    Relationship status: Not on file  Other Topics Concern  . Not on file  Social History Narrative  . Not on file     Review of  Systems: A 12 point ROS discussed and pertinent positives are indicated in the HPI above.  All other systems are negative.  Review of Systems  Vital Signs: BP (!) 149/82 (BP Location: Left Arm)   Pulse 90   Temp 98.1 F (36.7 C) (Oral)   Resp 18   Ht 5\' 8"  (1.727 m)   Wt 149 lb 9.6 oz (67.9 kg)   SpO2 95%   BMI 22.75 kg/m   Physical Exam  Constitutional: He is oriented to person, place, and time. He appears well-developed and well-nourished. He appears ill.  HENT:  Head: Normocephalic and atraumatic.  Eyes: No scleral icterus.  Cardiovascular: Normal rate and regular rhythm.  Pulmonary/Chest: Effort normal.  Abdominal: Soft. There is tenderness.  Neurological: He is alert and oriented to person, place, and  time.  Skin: Skin is warm and dry.  Nursing note and vitals reviewed.   Imaging: Dg Chest 2 View  Result Date: 03/11/2018 CLINICAL DATA:  77 y/o M; chest tightness and shortness of breath. Vomiting. History of COPD. EXAM: CHEST - 2 VIEW COMPARISON:  03/11/2018 CT chest.  01/14/2017 chest radiograph. FINDINGS: Normal cardiac silhouette. Aortic atherosclerosis with calcification. Stable hyperinflated lungs, emphysema, with flattened diaphragms compatible with COPD. No focal consolidation. No pleural effusion or pneumothorax. No acute osseous abnormality is evident. IMPRESSION: COPD and aortic atherosclerosis. No acute pulmonary process identified. Electronically Signed   By: Mitzi Hansen M.D.   On: 03/11/2018 23:14   Ct Angio Chest/abd/pel For Dissection W And/or Wo Contrast  Result Date: 03/11/2018 CLINICAL DATA:  Acute chest and back pain, shortness of breath, vomiting, pain across tops of shoulders, question aortic dissection; history hypertension, COPD, former smoker EXAM: CT ANGIOGRAPHY CHEST, ABDOMEN AND PELVIS TECHNIQUE: Multidetector CT imaging through the chest, abdomen and pelvis was performed using the standard protocol during bolus administration of intravenous contrast. Multiplanar reconstructed images and MIPs were obtained and reviewed to evaluate the vascular anatomy. CONTRAST:  75mL OMNIPAQUE IOHEXOL 350 MG/ML SOLN COMPARISON:  07/05/2007 FINDINGS: CTA CHEST FINDINGS Cardiovascular: Scattered atherosclerotic calcifications of aorta proximal great vessels. Minimal coronary arterial calcification. No intramural hematoma on precontrast imaging. 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 Following contrast administration, normal aortic enhancement is identified without evidence of aortic dissection. Pulmonary arteries adequately opacified and patent. No pericardial effusion. Mediastinum/Nodes: Prominent epicardial fat pad at RIGHT cardiophrenic angle.  Esophagus unremarkable. Small hiatal hernia. Base of cervical region normal appearance. No thoracic adenopathy. Lungs/Pleura: Diffuse emphysematous changes. Lungs clear. No pulmonary infiltrate, pleural effusion or pneumothorax. Musculoskeletal: Bones demineralized. No acute abnormalities. Degenerative disc disease changes thoracic spine. Vertebral hemangioma T6 vertebral body with bone island at LEFT pedicle T6. Review of the MIP images confirms the above findings. CTA ABDOMEN AND PELVIS FINDINGS VASCULAR Aorta: Diffuse atherosclerotic changes. Normal caliber. No dissection. Celiac: Minimal calcific plaque at origin.  Widely patent. SMA: Widely patent Renals: Widely patent BILATERAL renal arteries. IMA: Small but patent IMA Inflow: Scattered atherosclerotic calcifications, greatest at common iliac artery bifurcations. Veins: Limited but unremarkable on CTA exam Review of the MIP images confirms the above findings. NON-VASCULAR Hepatobiliary: Markedly thickened gallbladder wall with pericholecystic infiltration consistent with acute cholecystitis. Fatty infiltration of liver. Pancreas: Normal appearance Spleen: Normal appearance Adrenals/Urinary Tract: Adrenal glands, kidneys, ureters, and bladder normal appearance Stomach/Bowel: Appendix not visualized but no pericecal inflammatory process seen diverticulosis of descending and sigmoid colon without evidence of diverticulitis. Lymphatic: No adenopathy Reproductive: Prostatic enlargement, gland measuring 4.7 x 4.5 cm image 198 Other:  BILATERAL inguinal hernias containing fat. A nonobstructed small bowel loop extends into the RIGHT internal inguinal ring but not down the inguinal canal. Small umbilical hernia containing fat. No free air or free fluid. Musculoskeletal: Bones demineralized. Review of the MIP images confirms the above findings. IMPRESSION: Scattered atherosclerotic changes of the aorta without evidence of aortic dissection. Aneurysmal dilatation of the  ascending thoracic aorta 4.3 cm greatest diameter, recommendation below. Recommend annual imaging followup by CTA or MRA. This recommendation follows 2010 ACCF/AHA/AATS/ACR/ASA/SCA/SCAI/SIR/STS/SVM Guidelines for the Diagnosis and Management of Patients with Thoracic Aortic Disease. Circulation. 2010; 121: Z610-R604 Markedly thickened gallbladder wall with pericholecystic infiltrative changes consistent with acute cholecystitis Prostatic enlargement. BILATERAL inguinal and umbilical hernias as above. Distal colonic diverticulosis without evidence of diverticulitis. Emphysematous changes without infiltrate. Aortic Atherosclerosis (ICD10-I70.0). Aortic aneurysm NOS (ICD10-I71.9). Emphysema (ICD10-J43.9). Electronically Signed   By: Ulyses Southward M.D.   On: 03/11/2018 23:18   US Abdomen Limited Ruq  Result Date: 03/12/2018 CLINICAL DATA:  Abnormal CT examination EXAM: ULTRASOUND ABDOMEN LIMITED RIGHT UPPER QUADRANT COMPARISON:  03/11/2018 FINDINGS: Gallbladder: Diffuse gallbladder wall thickening is noted to 1 cm. Large partially calcified gallstone is noted centrally similar to that seen on prior CT examination. Positive sonographic Eulah Pont sign is noted as well. These changes are consistent with calculous cholecystitis. Common bile duct: Diameter: 3.5 mm. Liver: Diffuse increased echogenicity is noted consistent with fatty infiltration. Portal vein is patent on color Doppler imaging with normal direction of blood flow towards the liver. IMPRESSION: Changes of acute cholecystitis. Electronically Signed   By: Alcide Clever M.D.   On: 03/12/2018 08:43    Labs:  CBC: Recent Labs    03/11/18 2137 03/12/18 0509  WBC 7.0 15.1*  HGB 13.7 13.6  HCT 40.9 40.3  PLT 337 319    COAGS: No results for input(s): INR, APTT in the last 8760 hours.  BMP: Recent Labs    03/11/18 2137 03/12/18 0509  NA 139 141  K 4.4 3.9  CL 105 103  CO2 27 26  GLUCOSE 115* 153*  BUN 18 16  CALCIUM 9.4 9.5  CREATININE 1.14  1.09  GFRNONAA >60 >60  GFRAA >60 >60    LIVER FUNCTION TESTS: Recent Labs    03/12/18 0509  BILITOT 0.6  AST 30  ALT 21  ALKPHOS 119  PROT 7.8  ALBUMIN 4.1    TUMOR MARKERS: No results for input(s): AFPTM, CEA, CA199, CHROMGRNA in the last 8760 hours.  Assessment and Plan:  Acute calculous cholecystitis.  Patient currently refusing surgery.  Will proceed with placement of percutaneous cholecystostomy tube placement as a bridge to definitive cholecystectomy.   1.) To IR for perc chole placement  Thank you for this interesting consult.  I greatly enjoyed meeting Armanie Martine and look forward to participating in their care.  A copy of this report was sent to the requesting provider on this date.  Electronically Signed: Malachy Moan, MD 03/12/2018, 11:54 AM   I spent a total of 20 Minutes  in face to face in clinical consultation, greater than 50% of which was counseling/coordinating care for acute calculous cholecystitis

## 2018-03-12 NOTE — Consult Note (Signed)
MEDICATION-RELATED CONSULT NOTE   IR Procedure Consult - Anticoagulant/Antiplatelet PTA/Inpatient Med List Review by Pharmacist    Procedure: Placement of a 57F percutaneous cholecystostomy tube  Completed: 8/4 @ 1200  Post-Procedural bleeding risk per IR MD assessment:    Antithrombotic medications on inpatient or PTA profile prior to procedure:   Enoxaparin 40mg    Recommended restart time per IR Post-Procedure Guidelines:  Low Risk , Restart at least 4 hours or at next standard dose interval.    Plan:     Continue with current enoxaparin order for 0600 on 8/4 (Next standard dose)   Gardner CandleSheema M Amaurie Schreckengost, PharmD, BCPS Clinical Pharmacist 03/12/2018 12:44 PM

## 2018-03-12 NOTE — Plan of Care (Signed)
  Problem: Pain Managment: Goal: General experience of comfort will improve Outcome: Progressing   Problem: Clinical Measurements: Goal: Ability to maintain clinical measurements within normal limits will improve Outcome: Progressing   

## 2018-03-12 NOTE — ED Notes (Signed)
Pt c/o nausea-dry heaving

## 2018-03-12 NOTE — Progress Notes (Signed)
Discussed case with interventional radiologist.  He has reviewed the history and chart and will see the patient later today for cholecystostomy placement if deemed appropriate.

## 2018-03-12 NOTE — Progress Notes (Signed)
[facility] - [place] at [facility]   PATIENT NAME: Ross Montgomery    MR#:  [id number]  DATE OF BIRTH:  04/14/1941  SUBJECTIVE:  CHIEF COMPLAINT:   Chief Complaint  Patient presents with  . Chest Pain  . Shortness of Breath   -Chest pain, CT chest was negative.  However showed thickening of gallbladder.  Patient also complaining of nausea and vomiting. -Refuse cholecystectomy, status post cholecystostomy tube placed by IR  REVIEW OF SYSTEMS:  Review of Systems  Constitutional: Negative for chills, fever and malaise/fatigue.  HENT: Negative for ear discharge, hearing loss and nosebleeds.   Eyes: Negative for blurred vision and double vision.  Respiratory: Positive for shortness of breath. Negative for cough and wheezing.   Cardiovascular: Positive for chest pain. Negative for palpitations.  Gastrointestinal: Positive for abdominal pain and nausea. Negative for constipation, diarrhea and vomiting.  Genitourinary: Negative for dysuria.  Neurological: Negative for dizziness, focal weakness, seizures, weakness and headaches.  Psychiatric/Behavioral: Negative for depression.    DRUG ALLERGIES:  No Known Allergies  VITALS:  Blood pressure 121/72, pulse 68, temperature 98.2 F (36.8 C), temperature source Oral, resp. rate 20, height 5\' 8"  (1.727 m), weight 67.9 kg (149 lb 9.6 oz), SpO2 96 %.  PHYSICAL EXAMINATION:  Physical Exam  GENERAL:  77 y.o.-year-old patient lying in the bed with no acute distress.  EYES: Pupils equal, round, reactive to light and accommodation. No scleral icterus. Extraocular muscles intact.  HEENT: Head atraumatic, normocephalic. Oropharynx and nasopharynx clear.  NECK:  Supple, no jugular venous distention. No thyroid enlargement, no tenderness.  LUNGS: Normal breath sounds bilaterally, no wheezing, rales,rhonchi or crepitation. No use of accessory muscles of respiration.  Decreased bibasilar breath sounds CARDIOVASCULAR: S1, S2  normal. No murmurs, rubs, or gallops.  ABDOMEN: Soft, right upper quadrant tenderness, cholecystostomy tube in place, nondistended. Bowel sounds present. No organomegaly or mass.  EXTREMITIES: No pedal edema, cyanosis, or clubbing.  NEUROLOGIC: Cranial nerves II through XII are intact. Muscle strength 5/5 in all extremities. Sensation intact. Gait not checked.  PSYCHIATRIC: The patient is alert and oriented x 3.  SKIN: No obvious rash, lesion, or ulcer.    LABORATORY PANEL:   CBC Recent Labs  Lab 03/12/18 0509  WBC 15.1*  HGB 13.6  HCT 40.3  PLT 319   ------------------------------------------------------------------------------------------------------------------  Chemistries  Recent Labs  Lab 03/12/18 0509  NA 141  K 3.9  CL 103  CO2 26  GLUCOSE 153*  BUN 16  CREATININE 1.09  CALCIUM 9.5  AST 30  ALT 21  ALKPHOS 119  BILITOT 0.6   ------------------------------------------------------------------------------------------------------------------  Cardiac Enzymes Recent Labs  Lab 03/11/18 2137  TROPONINI <0.03   ------------------------------------------------------------------------------------------------------------------  RADIOLOGY:  Dg Chest 2 View  Result Date: 03/11/2018 CLINICAL DATA:  77 y/o M; chest tightness and shortness of breath. Vomiting. History of COPD. EXAM: CHEST - 2 VIEW COMPARISON:  03/11/2018 CT chest.  01/14/2017 chest radiograph. FINDINGS: Normal cardiac silhouette. Aortic atherosclerosis with calcification. Stable hyperinflated lungs, emphysema, with flattened diaphragms compatible with COPD. No focal consolidation. No pleural effusion or pneumothorax. No acute osseous abnormality is evident. IMPRESSION: COPD and aortic atherosclerosis. No acute pulmonary process identified. Electronically Signed   By: Mitzi Hansen M.D.   On: 03/11/2018 23:14   Ir Perc Cholecystostomy  Result Date: 03/12/2018 INDICATION: 77 year old male with  acute calculus cholecystitis. He also has advanced COPD and is currently short of breath and declining laparoscopic cholecystectomy. Therefore, we will proceed with placement of  a percutaneous cholecystostomy tube as a bridge to definitive cholecystectomy. EXAM: CHOLECYSTOSTOMY MEDICATIONS: 2 g Ancef; The antibiotic was administered within an appropriate time frame prior to the initiation of the procedure. ANESTHESIA/SEDATION: Moderate (conscious) sedation was employed during this procedure. A total of Versed 1 mg and Fentanyl 50 mcg was administered intravenously. Moderate Sedation Time: 13 minutes. The patient's level of consciousness and vital signs were monitored continuously by radiology nursing throughout the procedure under my direct supervision. FLUOROSCOPY TIME:  Fluoroscopy Time: 1 minutes 18 seconds (30 mGy). COMPLICATIONS: None immediate. PROCEDURE: Informed written consent was obtained from the patient after a thorough discussion of the procedural risks, benefits and alternatives. All questions were addressed. Maximal Sterile Barrier Technique was utilized including caps, mask, sterile gowns, sterile gloves, sterile drape, hand hygiene and skin antiseptic. A timeout was performed prior to the initiation of the procedure. Ultrasound was used to interrogate the right upper quadrant. A large echogenic stone is present within the gallbladder neck. The gallbladder wall is diffusely thickened and there is complex fluid in the lumen at the gallbladder fundus. A suitable skin entry site was selected and marked. Local anesthesia was attained by infiltration with 1% lidocaine. A small dermatotomy was made. Under real-time ultrasound guidance, a 21 gauge Accustick needle was carefully advanced along a short transhepatic course and into the gallbladder lumen. There was return of frankly purulent bile. Aim gentle contrast injection through the needle confirms the needle tip is present within the gallbladder lumen  and there is a large stone. The 0.018 wire was successfully coiled around the stone in the needle was exchanged for the Accustick sheath which was advanced into the gallbladder lumen. A 0.035 Amplatz wire was then advanced into the gallbladder lumen. The Accustick sheath was removed. The skin tract was dilated to 10 [place] and a [place] all-purpose drainage catheter was advanced over the wire and formed. There was aspiration of frankly purulent bile. A gentle hand injection of contrast material was performed confirming the location of the tube within the gallbladder lumen. The drain was then connected to bag drainage and secured to the skin with a combination of 0 Prolene suture and an adhesive fixation device. The patient tolerated the procedure well. IMPRESSION: Technically successful placement of a transhepatic percutaneous cholecystostomy tube for acute calculus cholecystitis. PLAN: 1. Maintain tube to gravity drainage. 2. Flush once per shift. 3. Follow cultures of purulent bile and adjust antibiotics as needed. 4. Interval cholecystostomy tube check and change in 6 weeks if cholecystectomy is not performed prior. 5. Interval elective cholecystectomy when the patient is clinically able. Signed, Sterling Big, MD [facility] Radiology Electronically Signed   By: Malachy Moan M.D.   On: 03/12/2018 12:53   Ct Angio Chest/abd/pel For Dissection W And/or Wo Contrast  Result Date: 03/11/2018 CLINICAL DATA:  Acute chest and back pain, shortness of breath, vomiting, pain across tops of shoulders, question aortic dissection; history hypertension, COPD, former smoker EXAM: CT ANGIOGRAPHY CHEST, ABDOMEN AND PELVIS TECHNIQUE: Multidetector CT imaging through the chest, abdomen and pelvis was performed using the standard protocol during bolus administration of intravenous contrast. Multiplanar reconstructed images and MIPs were obtained and reviewed to  evaluate the vascular anatomy. CONTRAST:  75mL OMNIPAQUE IOHEXOL 350 MG/ML SOLN COMPARISON:  07/05/2007 FINDINGS: CTA CHEST FINDINGS Cardiovascular: Scattered atherosclerotic calcifications of aorta proximal great vessels. Minimal coronary arterial calcification. No intramural hematoma on precontrast imaging. 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  Following contrast administration, normal aortic enhancement is identified without evidence of aortic dissection. Pulmonary arteries adequately opacified and patent. No pericardial effusion. Mediastinum/Nodes: Prominent epicardial fat pad at RIGHT cardiophrenic angle. Esophagus unremarkable. Small hiatal hernia. Base of cervical region normal appearance. No thoracic adenopathy. Lungs/Pleura: Diffuse emphysematous changes. Lungs clear. No pulmonary infiltrate, pleural effusion or pneumothorax. Musculoskeletal: Bones demineralized. No acute abnormalities. Degenerative disc disease changes thoracic spine. Vertebral hemangioma T6 vertebral body with bone island at LEFT pedicle T6. Review of the MIP images confirms the above findings. CTA ABDOMEN AND PELVIS FINDINGS VASCULAR Aorta: Diffuse atherosclerotic changes. Normal caliber. No dissection. Celiac: Minimal calcific plaque at origin.  Widely patent. SMA: Widely patent Renals: Widely patent BILATERAL renal arteries. IMA: Small but patent IMA Inflow: Scattered atherosclerotic calcifications, greatest at common iliac artery bifurcations. Veins: Limited but unremarkable on CTA exam Review of the MIP images confirms the above findings. NON-VASCULAR Hepatobiliary: Markedly thickened gallbladder wall with pericholecystic infiltration consistent with acute cholecystitis. Fatty infiltration of liver. Pancreas: Normal appearance Spleen: Normal appearance Adrenals/Urinary Tract: Adrenal glands, kidneys, ureters, and bladder normal appearance Stomach/Bowel: Appendix not visualized but no pericecal  inflammatory process seen diverticulosis of descending and sigmoid colon without evidence of diverticulitis. Lymphatic: No adenopathy Reproductive: Prostatic enlargement, gland measuring 4.7 x 4.5 cm image 198 Other: BILATERAL inguinal hernias containing fat. A nonobstructed small bowel loop extends into the RIGHT internal inguinal ring but not down the inguinal canal. Small umbilical hernia containing fat. No free air or free fluid. Musculoskeletal: Bones demineralized. Review of the MIP images confirms the above findings. IMPRESSION: Scattered atherosclerotic changes of the aorta without evidence of aortic dissection. 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 Recommend annual imaging followup by CTA or MRA. This recommendation follows 2010 ACCF/AHA/AATS/ACR/ASA/SCA/SCAI/SIR/STS/SVM Guidelines for the Diagnosis and Management of Patients with Thoracic Aortic Disease. Circulation. 2010; 121: [id number] Markedly thickened gallbladder wall with pericholecystic infiltrative changes consistent with acute cholecystitis Prostatic enlargement. BILATERAL inguinal and umbilical hernias as above. Distal colonic diverticulosis without evidence of diverticulitis. Emphysematous changes without infiltrate. Aortic Atherosclerosis (ICD10-I70.0). Aortic aneurysm NOS (ICD10-I71.9). Emphysema (ICD10-J43.9). Electronically Signed   By: Ulyses SouthwardMark  Boles M.D.   On: 03/11/2018 23:18   [place] Abdomen Limited Ruq  Result Date: 03/12/2018 CLINICAL DATA:  Abnormal CT examination EXAM: ULTRASOUND ABDOMEN LIMITED RIGHT UPPER QUADRANT COMPARISON:  03/11/2018 FINDINGS: Gallbladder: Diffuse gallbladder wall thickening is noted to 1 cm. Large partially calcified gallstone is noted centrally similar to that seen on prior CT examination. Positive sonographic Eulah PontMurphy sign is noted as well. These changes are consistent with calculous cholecystitis. Common bile duct: Diameter: 3.5 mm. Liver: Diffuse  increased echogenicity is noted consistent with fatty infiltration. Portal vein is patent on color Doppler imaging with normal direction of blood flow towards the liver. IMPRESSION: Changes of acute cholecystitis. Electronically Signed   By: Alcide CleverMark  Lukens M.D.   On: 03/12/2018 08:43    EKG:   Orders placed or performed during the hospital encounter of 03/11/18  . ED EKG within 10 minutes  . ED EKG within 10 minutes    ASSESSMENT AND PLAN:   77 year old male with no significant past medical history other than COPD and smoking presents to hospital secondary to chest pain and abdominal pain and noted to have acute cholecystitis.  1.  Acute cholecystitis-appreciate surgical input.  Patient refused invasive surgery at this time. -Status post IR cholecystostomy placement-fluid sent for cultures -Currently on Rocephin. -Continue to monitor WBC. -IV fluids and will start  diet today  2.  Hypertension-not on any medications at home.  Started on Norvasc for now and monitor  3.  UTI-follow-up cultures, started on Rocephin  4.  COPD-stable.  Continue inhalers.  5.  DVT prophylaxis-Lovenox  6.  Tobacco use disorder-started on nicotine patch      All the records are reviewed and case discussed with Care Management/Social Workerr. Management plans discussed with the patient, family and they are in agreement.  CODE STATUS: Full code  TOTAL TIME TAKING CARE OF THIS PATIENT: 37 minutes.   POSSIBLE D/C IN 1-2 DAYS, DEPENDING ON CLINICAL CONDITION.   Aviyah Swetz M.D on 03/12/2018 at 1:07 PM  Between 7am to 6pm - Pager - [phone]  After 6pm go to [email or web address] - password [email or web address]  [facility]  Office  [phone]  CC: Primary care physician; Patient, No Pcp Per

## 2018-03-12 NOTE — ED Notes (Addendum)
Report called to Jeannett SeniorStephen, RN on [facility]

## 2018-03-13 DIAGNOSIS — K819 Cholecystitis, unspecified: Secondary | ICD-10-CM

## 2018-03-13 LAB — CBC WITH DIFFERENTIAL/PLATELET
BASOS PCT: 0 %
Basophils Absolute: 0 10*3/uL (ref 0–0.1)
EOS ABS: 0 10*3/uL (ref 0–0.7)
EOS PCT: 0 %
HCT: 36.2 % — ABNORMAL LOW (ref 40.0–52.0)
Hemoglobin: 12.1 g/dL — ABNORMAL LOW (ref 13.0–18.0)
LYMPHS ABS: 1 10*3/uL (ref 1.0–3.6)
Lymphocytes Relative: 7 %
MCH: 29.8 pg (ref 26.0–34.0)
MCHC: 33.4 g/dL (ref 32.0–36.0)
MCV: 89.3 fL (ref 80.0–100.0)
MONOS PCT: 7 %
Monocytes Absolute: 0.9 10*3/uL (ref 0.2–1.0)
Neutro Abs: 11.1 10*3/uL — ABNORMAL HIGH (ref 1.4–6.5)
Neutrophils Relative %: 86 %
PLATELETS: 282 10*3/uL (ref 150–440)
RBC: 4.06 MIL/uL — AB (ref 4.40–5.90)
RDW: 14.9 % — ABNORMAL HIGH (ref 11.5–14.5)
WBC: 13 10*3/uL — AB (ref 3.8–10.6)

## 2018-03-13 LAB — COMPREHENSIVE METABOLIC PANEL
ALT: 17 U/L (ref 0–44)
ANION GAP: 6 (ref 5–15)
AST: 24 U/L (ref 15–41)
Albumin: 3.2 g/dL — ABNORMAL LOW (ref 3.5–5.0)
Alkaline Phosphatase: 93 U/L (ref 38–126)
BUN: 21 mg/dL (ref 8–23)
CHLORIDE: 107 mmol/L (ref 98–111)
CO2: 28 mmol/L (ref 22–32)
Calcium: 8.9 mg/dL (ref 8.9–10.3)
Creatinine, Ser: 1.14 mg/dL (ref 0.61–1.24)
GFR calc non Af Amer: >60 mL/min (ref >60)
Glucose, Bld: 117 mg/dL — ABNORMAL HIGH (ref 70–99)
Potassium: 4.5 mmol/L (ref 3.5–5.1)
Sodium: 141 mmol/L (ref 135–145)
Total Bilirubin: 0.5 mg/dL (ref 0.3–1.2)
Total Protein: 6.1 g/dL — ABNORMAL LOW (ref 6.5–8.1)

## 2018-03-13 MED ORDER — PIPERACILLIN-TAZOBACTAM 3.375 G IVPB
3.3750 g | Freq: Three times a day (TID) | INTRAVENOUS | Status: DC
  Administered 2018-03-13 – 2018-03-14 (×3): 3.375 g via INTRAVENOUS
  Filled 2018-03-13 (×3): qty 50

## 2018-03-13 MED ORDER — MORPHINE SULFATE (PF) 2 MG/ML IV SOLN: 2.0000 mg | INTRAVENOUS | Status: DC | PRN

## 2018-03-13 NOTE — Progress Notes (Signed)
CC: Right upper quadrant pain Subjective: This patient with acute cholecystitis.  He had a cholecystostomy tube yesterday which drained frank pus on placement.  He feels much better and is afebrile.  He wants to eat.  Objective: Vital signs in last 24 hours: Temp:  [97.7 F (36.5 C)-98.2 F (36.8 C)] 97.7 F (36.5 C) (08/05 0451) Pulse Rate:  [64-75] 64 (08/05 0451) Resp:  [10-20] 14 (08/05 0451) BP: (111-143)/(58-77) 133/58 (08/05 0451) SpO2:  [94 %-99 %] 95 % (08/05 0451) Last BM Date: 03/11/18  Intake/Output from previous day: 08/04 0701 - 08/05 0700 In: 1665 [I.V.:1650] Out: 860 [Urine:850; Drains:10] Intake/Output this shift: No intake/output data recorded.  Physical exam:  Vital signs stable.  Abdomen is soft and much less tender than yesterday.  Drain is in place with bloody fluid in drain.  Calves are nontender no icterus no jaundice  Lab Results: CBC  Recent Labs    03/12/18 0509 03/13/18 0354  WBC 15.1* 13.0*  HGB 13.6 12.1*  HCT 40.3 36.2*  PLT 319 282   BMET Recent Labs    03/12/18 0509 03/13/18 0354  NA 141 141  K 3.9 4.5  CL 103 107  CO2 26 28  GLUCOSE 153* 117*  BUN 16 21  CREATININE 1.09 1.14  CALCIUM 9.5 8.9   PT/INR No results for input(s): LABPROT, INR in the last 72 hours. ABG No results for input(s): PHART, HCO3 in the last 72 hours.  Invalid input(s): PCO2, PO2  Studies/Results: Dg Chest 2 View  Result Date: 03/11/2018 CLINICAL DATA:  77 y/o M; chest tightness and shortness of breath. Vomiting. History of COPD. EXAM: CHEST - 2 VIEW COMPARISON:  03/11/2018 CT chest.  01/14/2017 chest radiograph. FINDINGS: Normal cardiac silhouette. Aortic atherosclerosis with calcification. Stable hyperinflated lungs, emphysema, with flattened diaphragms compatible with COPD. No focal consolidation. No pleural effusion or pneumothorax. No acute osseous abnormality is evident. IMPRESSION: COPD and aortic atherosclerosis. No acute pulmonary process  identified. Electronically Signed   By: Mitzi Hansen M.D.   On: 03/11/2018 23:14   Ir Perc Cholecystostomy  Result Date: 03/12/2018 INDICATION: 77 year old male with acute calculus cholecystitis. He also has advanced COPD and is currently short of breath and declining laparoscopic cholecystectomy. Therefore, we will proceed with placement of a percutaneous cholecystostomy tube as a bridge to definitive cholecystectomy. EXAM: CHOLECYSTOSTOMY MEDICATIONS: 2 g Ancef; The antibiotic was administered within an appropriate time frame prior to the initiation of the procedure. ANESTHESIA/SEDATION: Moderate (conscious) sedation was employed during this procedure. A total of Versed 1 mg and Fentanyl 50 mcg was administered intravenously. Moderate Sedation Time: 13 minutes. The patient's level of consciousness and vital signs were monitored continuously by radiology nursing throughout the procedure under my direct supervision. FLUOROSCOPY TIME:  Fluoroscopy Time: 1 minutes 18 seconds (30 mGy). COMPLICATIONS: None immediate. PROCEDURE: Informed written consent was obtained from the patient after a thorough discussion of the procedural risks, benefits and alternatives. All questions were addressed. Maximal Sterile Barrier Technique was utilized including caps, mask, sterile gowns, sterile gloves, sterile drape, hand hygiene and skin antiseptic. A timeout was performed prior to the initiation of the procedure. Ultrasound was used to interrogate the right upper quadrant. A large echogenic stone is present within the gallbladder neck. The gallbladder wall is diffusely thickened and there is complex fluid in the lumen at the gallbladder fundus. A suitable skin entry site was selected and marked. Local anesthesia was attained by infiltration with 1% lidocaine. A small dermatotomy was made. Under  real-time ultrasound guidance, a 21 gauge Accustick needle was carefully advanced along a short transhepatic course and into  the gallbladder lumen. There was return of frankly purulent bile. Aim gentle contrast injection through the needle confirms the needle tip is present within the gallbladder lumen and there is a large stone. The 0.018 wire was successfully coiled around the stone in the needle was exchanged for the Accustick sheath which was advanced into the gallbladder lumen. A 0.035 Amplatz wire was then advanced into the gallbladder lumen. The Accustick sheath was removed. The skin tract was dilated to 10 [place] and a [place] all-purpose drainage catheter was advanced over the wire and formed. There was aspiration of frankly purulent bile. A gentle hand injection of contrast material was performed confirming the location of the tube within the gallbladder lumen. The drain was then connected to bag drainage and secured to the skin with a combination of 0 Prolene suture and an adhesive fixation device. The patient tolerated the procedure well. IMPRESSION: Technically successful placement of a transhepatic percutaneous cholecystostomy tube for acute calculus cholecystitis. PLAN: 1. Maintain tube to gravity drainage. 2. Flush once per shift. 3. Follow cultures of purulent bile and adjust antibiotics as needed. 4. Interval cholecystostomy tube check and change in 6 weeks if cholecystectomy is not performed prior. 5. Interval elective cholecystectomy when the patient is clinically able. Signed, Sterling Big, MD [facility] Radiology Electronically Signed   By: Malachy Moan M.D.   On: 03/12/2018 12:53   Ct Angio Chest/abd/pel For Dissection W And/or Wo Contrast  Result Date: 03/11/2018 CLINICAL DATA:  Acute chest and back pain, shortness of breath, vomiting, pain across tops of shoulders, question aortic dissection; history hypertension, COPD, former smoker EXAM: CT ANGIOGRAPHY CHEST, ABDOMEN AND PELVIS TECHNIQUE: Multidetector CT imaging through the chest, abdomen  and pelvis was performed using the standard protocol during bolus administration of intravenous contrast. Multiplanar reconstructed images and MIPs were obtained and reviewed to evaluate the vascular anatomy. CONTRAST:  75mL OMNIPAQUE IOHEXOL 350 MG/ML SOLN COMPARISON:  07/05/2007 FINDINGS: CTA CHEST FINDINGS Cardiovascular: Scattered atherosclerotic calcifications of aorta proximal great vessels. Minimal coronary arterial calcification. No intramural hematoma on precontrast imaging. 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 Following contrast administration, normal aortic enhancement is identified without evidence of aortic dissection. Pulmonary arteries adequately opacified and patent. No pericardial effusion. Mediastinum/Nodes: Prominent epicardial fat pad at RIGHT cardiophrenic angle. Esophagus unremarkable. Small hiatal hernia. Base of cervical region normal appearance. No thoracic adenopathy. Lungs/Pleura: Diffuse emphysematous changes. Lungs clear. No pulmonary infiltrate, pleural effusion or pneumothorax. Musculoskeletal: Bones demineralized. No acute abnormalities. Degenerative disc disease changes thoracic spine. Vertebral hemangioma T6 vertebral body with bone island at LEFT pedicle T6. Review of the MIP images confirms the above findings. CTA ABDOMEN AND PELVIS FINDINGS VASCULAR Aorta: Diffuse atherosclerotic changes. Normal caliber. No dissection. Celiac: Minimal calcific plaque at origin.  Widely patent. SMA: Widely patent Renals: Widely patent BILATERAL renal arteries. IMA: Small but patent IMA Inflow: Scattered atherosclerotic calcifications, greatest at common iliac artery bifurcations. Veins: Limited but unremarkable on CTA exam Review of the MIP images confirms the above findings. NON-VASCULAR Hepatobiliary: Markedly thickened gallbladder wall with pericholecystic infiltration consistent with acute cholecystitis. Fatty infiltration of liver. Pancreas: Normal appearance  Spleen: Normal appearance Adrenals/Urinary Tract: Adrenal glands, kidneys, ureters, and bladder normal appearance Stomach/Bowel: Appendix not visualized but no pericecal inflammatory process seen diverticulosis of descending and sigmoid colon without evidence of diverticulitis. Lymphatic: No  adenopathy Reproductive: Prostatic enlargement, gland measuring 4.7 x 4.5 cm image 198 Other: BILATERAL inguinal hernias containing fat. A nonobstructed small bowel loop extends into the RIGHT internal inguinal ring but not down the inguinal canal. Small umbilical hernia containing fat. No free air or free fluid. Musculoskeletal: Bones demineralized. Review of the MIP images confirms the above findings. IMPRESSION: Scattered atherosclerotic changes of the aorta without evidence of aortic dissection. 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 Recommend annual imaging followup by CTA or MRA. This recommendation follows 2010 ACCF/AHA/AATS/ACR/ASA/SCA/SCAI/SIR/STS/SVM Guidelines for the Diagnosis and Management of Patients with Thoracic Aortic Disease. Circulation. 2010; 121: [id number] Markedly thickened gallbladder wall with pericholecystic infiltrative changes consistent with acute cholecystitis Prostatic enlargement. BILATERAL inguinal and umbilical hernias as above. Distal colonic diverticulosis without evidence of diverticulitis. Emphysematous changes without infiltrate. Aortic Atherosclerosis (ICD10-I70.0). Aortic aneurysm NOS (ICD10-I71.9). Emphysema (ICD10-J43.9). Electronically Signed   By: Ulyses SouthwardMark  Boles M.D.   On: 03/11/2018 23:18   [place] Abdomen Limited Ruq  Result Date: 03/12/2018 CLINICAL DATA:  Abnormal CT examination EXAM: ULTRASOUND ABDOMEN LIMITED RIGHT UPPER QUADRANT COMPARISON:  03/11/2018 FINDINGS: Gallbladder: Diffuse gallbladder wall thickening is noted to 1 cm. Large partially calcified gallstone is noted centrally similar to that seen on prior CT  examination. Positive sonographic Eulah PontMurphy sign is noted as well. These changes are consistent with calculous cholecystitis. Common bile duct: Diameter: 3.5 mm. Liver: Diffuse increased echogenicity is noted consistent with fatty infiltration. Portal vein is patent on color Doppler imaging with normal direction of blood flow towards the liver. IMPRESSION: Changes of acute cholecystitis. Electronically Signed   By: Alcide CleverMark  Lukens M.D.   On: 03/12/2018 08:43    Anti-infectives: Anti-infectives (From admission, onward)   Start     Dose/Rate Route Frequency Ordered Stop   03/12/18 1245  ceFAZolin (ANCEF) 2-4 GM/100ML-% IVPB    Note to Pharmacy:  Arturo Mortonietz, Juanita   : cabinet override      03/12/18 1245 03/13/18 0059   03/12/18 1155  ceFAZolin (ANCEF) IVPB 1 g/50 mL premix     over 30 Minutes Intravenous Continuous PRN 03/12/18 1155 03/12/18 1155   03/12/18 0145  cefTRIAXone (ROCEPHIN) 1 g in sodium chloride 0.9 % 100 mL IVPB     1 g 200 mL/hr over 30 Minutes Intravenous Every 24 hours 03/12/18 0143        Assessment/Plan:  Labs reviewed elevated white blood cell count with normal LFTs.  Patient doing very well following cholecystostomy tube placement for acute cholecystitis with likely empyema of the gallbladder.  I would recommend the patient stay on IV antibiotics today and probably be discharged tomorrow on 10 days of oral antibiotics.  He will follow-up with me in 1 week and will discuss timing of cholecystectomy.  He was understanding of this plan.  I will advance diet today.  Lattie Hawichard E Mariadel Mruk, MD, FACS  03/13/2018

## 2018-03-13 NOTE — Progress Notes (Signed)
[employer] [facility] [facility]   PATIENT NAME: Ross InfanteRalph Montgomery    MR#:  [id number]  DATE OF BIRTH:  10/15/1940  SUBJECTIVE:  CHIEF COMPLAINT:   Chief Complaint  Patient presents with  . Chest Pain  . Shortness of Breath   -Acute cholecystitis, status post cholecystostomy tube placement yesterday. -Tolerating diet today.  REVIEW OF SYSTEMS:  Review of Systems  Constitutional: Negative for chills, fever and malaise/fatigue.  HENT: Negative for ear discharge, hearing loss and nosebleeds.   Eyes: Negative for blurred vision and double vision.  Respiratory: Negative for cough, shortness of breath and wheezing.   Cardiovascular: Negative for palpitations.  Gastrointestinal: Positive for abdominal pain. Negative for constipation, diarrhea, nausea and vomiting.  Genitourinary: Negative for dysuria.  Neurological: Negative for dizziness, focal weakness, seizures, weakness and headaches.  Psychiatric/Behavioral: Negative for depression.    DRUG ALLERGIES:  No Known Allergies  VITALS:  Blood pressure 133/74, pulse 66, temperature 97.7 F (36.5 C), temperature source Oral, resp. rate 17, height 5\' 8"  (1.727 m), weight 67.9 kg (149 lb 9.6 oz), SpO2 91 %.  PHYSICAL EXAMINATION:  Physical Exam  GENERAL:  77 y.o.-year-old patient lying in the bed with no acute distress.  EYES: Pupils equal, round, reactive to light and accommodation. No scleral icterus. Extraocular muscles intact.  HEENT: Head atraumatic, normocephalic. Oropharynx and nasopharynx clear.  NECK:  Supple, no jugular venous distention. No thyroid enlargement, no tenderness.  LUNGS: Normal breath sounds bilaterally, no wheezing, rales,rhonchi or crepitation. No use of accessory muscles of respiration.  Decreased bibasilar breath sounds CARDIOVASCULAR: S1, S2 normal. No murmurs, rubs, or gallops.  ABDOMEN: Soft, right upper quadrant tenderness, cholecystostomy tube in place, nondistended. Bowel sounds  present. No organomegaly or mass.  EXTREMITIES: No pedal edema, cyanosis, or clubbing.  NEUROLOGIC: Cranial nerves II through XII are intact. Muscle strength 5/5 in all extremities. Sensation intact. Gait not checked.  PSYCHIATRIC: The patient is alert and oriented x 3.  SKIN: No obvious rash, lesion, or ulcer.    LABORATORY PANEL:   CBC Recent Labs  Lab 03/13/18 0354  WBC 13.0*  HGB 12.1*  HCT 36.2*  PLT 282   ------------------------------------------------------------------------------------------------------------------  Chemistries  Recent Labs  Lab 03/13/18 0354  NA 141  K 4.5  CL 107  CO2 28  GLUCOSE 117*  BUN 21  CREATININE 1.14  CALCIUM 8.9  AST 24  ALT 17  ALKPHOS 93  BILITOT 0.5   ------------------------------------------------------------------------------------------------------------------  Cardiac Enzymes Recent Labs  Lab 03/11/18 2137  TROPONINI <0.03   ------------------------------------------------------------------------------------------------------------------  RADIOLOGY:  Dg Chest 2 View  Result Date: 03/11/2018 CLINICAL DATA:  77 y/o M; chest tightness and shortness of breath. Vomiting. History of COPD. EXAM: CHEST - 2 VIEW COMPARISON:  03/11/2018 CT chest.  01/14/2017 chest radiograph. FINDINGS: Normal cardiac silhouette. Aortic atherosclerosis with calcification. Stable hyperinflated lungs, emphysema, with flattened diaphragms compatible with COPD. No focal consolidation. No pleural effusion or pneumothorax. No acute osseous abnormality is evident. IMPRESSION: COPD and aortic atherosclerosis. No acute pulmonary process identified. Electronically Signed   By: Mitzi HansenLance  Furusawa-Stratton M.D.   On: 03/11/2018 23:14   Ir Perc Cholecystostomy  Result Date: 03/12/2018 INDICATION: 77 year old male with acute calculus cholecystitis. He also has advanced COPD and is currently short of breath and declining laparoscopic cholecystectomy. Therefore, we  will proceed with placement of a percutaneous cholecystostomy tube as a bridge to definitive cholecystectomy. EXAM: CHOLECYSTOSTOMY MEDICATIONS: 2 g Ancef; The antibiotic was administered within an appropriate time frame  prior to the initiation of the procedure. ANESTHESIA/SEDATION: Moderate (conscious) sedation was employed during this procedure. A total of Versed 1 mg and Fentanyl 50 mcg was administered intravenously. Moderate Sedation Time: 13 minutes. The patient's level of consciousness and vital signs were monitored continuously by radiology nursing throughout the procedure under my direct supervision. FLUOROSCOPY TIME:  Fluoroscopy Time: 1 minutes 18 seconds (30 mGy). COMPLICATIONS: None immediate. PROCEDURE: Informed written consent was obtained from the patient after a thorough discussion of the procedural risks, benefits and alternatives. All questions were addressed. Maximal Sterile Barrier Technique was utilized including caps, mask, sterile gowns, sterile gloves, sterile drape, hand hygiene and skin antiseptic. A timeout was performed prior to the initiation of the procedure. Ultrasound was used to interrogate the right upper quadrant. A large echogenic stone is present within the gallbladder neck. The gallbladder wall is diffusely thickened and there is complex fluid in the lumen at the gallbladder fundus. A suitable skin entry site was selected and marked. Local anesthesia was attained by infiltration with 1% lidocaine. A small dermatotomy was made. Under real-time ultrasound guidance, a 21 gauge Accustick needle was carefully advanced along a short transhepatic course and into the gallbladder lumen. There was return of frankly purulent bile. Aim gentle contrast injection through the needle confirms the needle tip is present within the gallbladder lumen and there is a large stone. The 0.018 wire was successfully coiled around the stone in the needle was exchanged for the Accustick sheath which was  advanced into the gallbladder lumen. A 0.035 Amplatz wire was then advanced into the gallbladder lumen. The Accustick sheath was removed. The skin tract was dilated to 10 [place] and a [place] all-purpose drainage catheter was advanced over the wire and formed. There was aspiration of frankly purulent bile. A gentle hand injection of contrast material was performed confirming the location of the tube within the gallbladder lumen. The drain was then connected to bag drainage and secured to the skin with a combination of 0 Prolene suture and an adhesive fixation device. The patient tolerated the procedure well. IMPRESSION: Technically successful placement of a transhepatic percutaneous cholecystostomy tube for acute calculus cholecystitis. PLAN: 1. Maintain tube to gravity drainage. 2. Flush once per shift. 3. Follow cultures of purulent bile and adjust antibiotics as needed. 4. Interval cholecystostomy tube check and change in 6 weeks if cholecystectomy is not performed prior. 5. Interval elective cholecystectomy when the patient is clinically able. Signed, Sterling Big, MD [facility] Electronically Signed   By: Malachy Moan M.D.   On: 03/12/2018 12:53   Ct Angio Chest/abd/pel For Dissection W And/or Wo Contrast  Result Date: 03/11/2018 CLINICAL DATA:  Acute chest and back pain, shortness of breath, vomiting, pain across tops of shoulders, question aortic dissection; history hypertension, COPD, former smoker EXAM: CT ANGIOGRAPHY CHEST, ABDOMEN AND PELVIS TECHNIQUE: Multidetector CT imaging through the chest, abdomen and pelvis was performed using the standard protocol during bolus administration of intravenous contrast. Multiplanar reconstructed images and MIPs were obtained and reviewed to evaluate the vascular anatomy. CONTRAST:  75mL OMNIPAQUE IOHEXOL 350 MG/ML SOLN COMPARISON:  07/05/2007 FINDINGS: CTA CHEST FINDINGS Cardiovascular:  Scattered atherosclerotic calcifications of aorta proximal great vessels. Minimal coronary arterial calcification. No intramural hematoma on precontrast imaging. 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 Following contrast administration, normal aortic enhancement is identified without evidence of aortic dissection. Pulmonary arteries adequately opacified and patent. No pericardial effusion. Mediastinum/Nodes: Prominent epicardial  fat pad at RIGHT cardiophrenic angle. Esophagus unremarkable. Small hiatal hernia. Base of cervical region normal appearance. No thoracic adenopathy. Lungs/Pleura: Diffuse emphysematous changes. Lungs clear. No pulmonary infiltrate, pleural effusion or pneumothorax. Musculoskeletal: Bones demineralized. No acute abnormalities. Degenerative disc disease changes thoracic spine. Vertebral hemangioma T6 vertebral body with bone island at LEFT pedicle T6. Review of the MIP images confirms the above findings. CTA ABDOMEN AND PELVIS FINDINGS VASCULAR Aorta: Diffuse atherosclerotic changes. Normal caliber. No dissection. Celiac: Minimal calcific plaque at origin.  Widely patent. SMA: Widely patent Renals: Widely patent BILATERAL renal arteries. IMA: Small but patent IMA Inflow: Scattered atherosclerotic calcifications, greatest at common iliac artery bifurcations. Veins: Limited but unremarkable on CTA exam Review of the MIP images confirms the above findings. NON-VASCULAR Hepatobiliary: Markedly thickened gallbladder wall with pericholecystic infiltration consistent with acute cholecystitis. Fatty infiltration of liver. Pancreas: Normal appearance Spleen: Normal appearance Adrenals/Urinary Tract: Adrenal glands, kidneys, ureters, and bladder normal appearance Stomach/Bowel: Appendix not visualized but no pericecal inflammatory process seen diverticulosis of descending and sigmoid colon without evidence of diverticulitis. Lymphatic: No adenopathy Reproductive:  Prostatic enlargement, gland measuring 4.7 x 4.5 cm image 198 Other: BILATERAL inguinal hernias containing fat. A nonobstructed small bowel loop extends into the RIGHT internal inguinal ring but not down the inguinal canal. Small umbilical hernia containing fat. No free air or free fluid. Musculoskeletal: Bones demineralized. Review of the MIP images confirms the above findings. IMPRESSION: Scattered atherosclerotic changes of the aorta without evidence of aortic dissection. 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 Recommend annual imaging followup by CTA or MRA. This recommendation follows 2010 ACCF/AHA/AATS/ACR/ASA/SCA/SCAI/SIR/STS/SVM Guidelines for the Diagnosis and Management of Patients with Thoracic Aortic Disease. Circulation. 2010; 121: Z610-R604 Markedly thickened gallbladder wall with pericholecystic infiltrative changes consistent with acute cholecystitis Prostatic enlargement. BILATERAL inguinal and umbilical hernias as above. Distal colonic diverticulosis without evidence of diverticulitis. Emphysematous changes without infiltrate. Aortic Atherosclerosis (ICD10-I70.0). Aortic aneurysm NOS (ICD10-I71.9). Emphysema (ICD10-J43.9). Electronically Signed   By: Ulyses Southward M.D.   On: 03/11/2018 23:18   US Abdomen Limited Ruq  Result Date: 03/12/2018 CLINICAL DATA:  Abnormal CT examination EXAM: ULTRASOUND ABDOMEN LIMITED RIGHT UPPER QUADRANT COMPARISON:  03/11/2018 FINDINGS: Gallbladder: Diffuse gallbladder wall thickening is noted to 1 cm. Large partially calcified gallstone is noted centrally similar to that seen on prior CT examination. Positive sonographic Eulah Pont sign is noted as well. These changes are consistent with calculous cholecystitis. Common bile duct: Diameter: 3.5 mm. Liver: Diffuse increased echogenicity is noted consistent with fatty infiltration. Portal vein is patent on color Doppler imaging with normal direction of blood flow  towards the liver. IMPRESSION: Changes of acute cholecystitis. Electronically Signed   By: Alcide Clever M.D.   On: 03/12/2018 08:43    EKG:   Orders placed or performed during the hospital encounter of 03/11/18  . ED EKG within 10 minutes  . ED EKG within 10 minutes    ASSESSMENT AND PLAN:   77 year old male with no significant past medical history other than COPD and smoking presents to hospital secondary to chest pain and abdominal pain and noted to have acute cholecystitis.  1.  Acute cholecystitis-appreciate surgical input.  Patient refused invasive surgery at this time. -Status post IR cholecystostomy placement on 03/12/18-fluid sent for cultures -Change antibiotics to Zosyn for anaerobic coverage as well.  Cholecystostomy tube initially drained purulent material. -Surgery recommended 1 more day of IV antibiotics and then discharged tomorrow on oral antibiotics. -Follow-up with surgery as  outpatient in 1 week to 10 days for outpatient laparoscopic cholecystectomy. -Patient tolerating a regular diet today. - discontinue IV fluids   2.  Hypertension-not on any medications at home.  Started on Norvasc for now and monitor -Much improved blood pressure  3.  UTI-follow-up cultures, on Zosyn for now  4.  COPD-stable.  Continue inhalers.  5.  DVT prophylaxis-Lovenox  6.  Tobacco use disorder-on nicotine patch   Encourage ambulation today.  Anticipate discharge tomorrow.   All the records are reviewed and case discussed with Care Management/Social Workerr. Management plans discussed with the patient, family and they are in agreement.  CODE STATUS: Full code  TOTAL TIME TAKING CARE OF THIS PATIENT: 37 minutes.   POSSIBLE D/C TOMORROW, DEPENDING ON CLINICAL CONDITION.   Jerusalem Brownstein M.D on 03/13/2018 at 2:34 PM  Between 7am to 6pm - Pager - [phone]  After 6pm go to [email or web address] - password [email or web address]  [facility]  Office   [phone]  CC: Primary care physician; Patient, No Pcp Per

## 2018-03-13 NOTE — Progress Notes (Signed)
Pharmacy Antibiotic Note  Ross Montgomery is a 77 y.o. male admitted on 03/11/2018 with Intra-Abdominal infection.  Pharmacy has been consulted for Zosyn dosing.  Plan: Zosyn 3.375g IV q8h (4 hour infusion).    Height: 5\' 8"  (172.7 cm) Weight: 149 lb 9.6 oz (67.9 kg) IBW/kg (Calculated) : 68.4  Temp (24hrs), Avg:97.9 F (36.6 C), Min:97.7 F (36.5 C), Max:98.2 F (36.8 C)  Recent Labs  Lab 03/11/18 2137 03/12/18 0509 03/13/18 0354  WBC 7.0 15.1* 13.0*  CREATININE 1.14 1.09 1.14    Estimated Creatinine Clearance: 52.9 mL/min (by C-G formula based on SCr of 1.14 mg/dL).    No Known Allergies  Antimicrobials this admission: Zosyn 8/5  >>   CTX  8/4 >> 8/5  Dose adjustments this admission:    Microbiology results:   BCx:   8/4 UCx: pend    Sputum:      MRSA PCR:   Bile cx: few gram variable rods  Thank you for allowing pharmacy to be a part of this patient's care.  Kelyn Ponciano A 03/13/2018 8:26 AM

## 2018-03-14 LAB — CBC WITH DIFFERENTIAL/PLATELET
BASOS ABS: 0 10*3/uL (ref 0–0.1)
Basophils Relative: 0 %
EOS PCT: 1 %
Eosinophils Absolute: 0.1 10*3/uL (ref 0–0.7)
HEMATOCRIT: 36.1 % — AB (ref 40.0–52.0)
Hemoglobin: 12.3 g/dL — ABNORMAL LOW (ref 13.0–18.0)
LYMPHS ABS: 1.3 10*3/uL (ref 1.0–3.6)
Lymphocytes Relative: 17 %
MCH: 29.9 pg (ref 26.0–34.0)
MCHC: 34 g/dL (ref 32.0–36.0)
MCV: 88.1 fL (ref 80.0–100.0)
MONO ABS: 0.8 10*3/uL (ref 0.2–1.0)
Monocytes Relative: 10 %
NEUTROS ABS: 5.8 10*3/uL (ref 1.4–6.5)
Neutrophils Relative %: 72 %
PLATELETS: 281 10*3/uL (ref 150–440)
RBC: 4.1 MIL/uL — ABNORMAL LOW (ref 4.40–5.90)
RDW: 15.1 % — AB (ref 11.5–14.5)
WBC: 8 10*3/uL (ref 3.8–10.6)

## 2018-03-14 LAB — URINE CULTURE: Culture: NO GROWTH

## 2018-03-14 LAB — BASIC METABOLIC PANEL
ANION GAP: 6 (ref 5–15)
BUN: 19 mg/dL (ref 8–23)
CO2: 27 mmol/L (ref 22–32)
Calcium: 8.9 mg/dL (ref 8.9–10.3)
Chloride: 108 mmol/L (ref 98–111)
Creatinine, Ser: 1.18 mg/dL (ref 0.61–1.24)
GFR calc Af Amer: >60 mL/min (ref >60)
GFR, EST NON AFRICAN AMERICAN: 58 mL/min — AB (ref >60)
Glucose, Bld: 106 mg/dL — ABNORMAL HIGH (ref 70–99)
POTASSIUM: 3.9 mmol/L (ref 3.5–5.1)
Sodium: 141 mmol/L (ref 135–145)

## 2018-03-14 MED ORDER — AMOXICILLIN-POT CLAVULANATE 875-125 MG PO TABS
1.0000 | ORAL_TABLET | Freq: Two times a day (BID) | ORAL | Status: DC
  Administered 2018-03-14: 1 via ORAL
  Filled 2018-03-14: qty 1

## 2018-03-14 MED ORDER — AMLODIPINE BESYLATE 5 MG PO TABS: 5.0000 mg | ORAL_TABLET | Freq: Every day | ORAL | 0 refills | Status: AC

## 2018-03-14 MED ORDER — AMOXICILLIN-POT CLAVULANATE 875-125 MG PO TABS: 1.0000 | ORAL_TABLET | Freq: Two times a day (BID) | ORAL | 0 refills | Status: DC

## 2018-03-14 NOTE — Discharge Instructions (Signed)
Flush your drain with 5-ml of normal saline three times a day (morning, mid afternoon and bedtime).  Empty and record drainage at least every twelve hours, more often if needed.  Cholecystostomy, Care After Refer to this sheet in the next few weeks. These instructions provide you with information about caring for yourself after your procedure. Your health care provider may also give you more specific instructions. Your treatment has been planned according to current medical practices, but problems sometimes occur. Call your health care provider if you have any problems or questions after your procedure. What can I expect after the procedure? After your procedure, it is common to have soreness near the site of your drainage tube (catheter) or your incision. Follow these instructions at home: Incision care  Follow instructions from your health care provider about how to take care of your incision. Make sure you: ? Wash your hands with soap and water before you change your bandage (dressing). If soap and water are not available, use hand sanitizer. ? Change your dressing as told by your health care provider. ? Leave stitches (sutures), skin glue, or adhesive strips in place. These skin closures may need to be in place for 2 weeks or longer. If adhesive strip edges start to loosen and curl up, you may trim the loose edges. Do not remove adhesive strips completely unless your health care provider tells you to do that.  Check your incision and your drainage site every day for signs of infection. Watch for: ? Redness, swelling, or pain. ? Fluid, blood, or pus. General instructions  If you were sent home with a surgical drain in place, follow instructions from your health care provider about how to care for your drain and collection bag at home.  Do not take baths, swim, or use a hot tub until your health care provider approves. Ask your health care provider if you can take showers. You may only be  allowed to take sponge baths for bathing.  Follow instructions from your health care provider about what you may eat or drink.  Take over-the-counter and prescription medicines only as told by your health care provider.  Keep all follow-up visits as told by your health care provider. This is important. Contact a health care provider if:  You have redness, swelling, or pain at your incision or drainage site.  You have nausea or vomiting. Get help right away if:  Your abdominal pain gets worse.  You feel dizzy or you faint while standing.  You have fluid, blood, or pus coming from your incision or drainage site.  You have a fever.  You have shortness of breath.  You have a rapid heartbeat.  Your nausea or vomiting does not go away.  Your drainage tube becomes blocked.  Your drainage tube comes out of your abdomen. This information is not intended to replace advice given to you by your health care provider. Make sure you discuss any questions you have with your health care provider. Document Released: 04/16/2015 Document Revised: 01/01/2016 Document Reviewed: 11/06/2014 Elsevier Interactive Patient Education  2018 [employer] Smoking cessation.

## 2018-03-14 NOTE — Discharge Summary (Signed)
[facility] [place] at [facility]   PATIENT NAME: Ross Montgomery    MR#:  [id number]  DATE OF BIRTH:  1940-10-19  DATE OF ADMISSION:  03/11/2018   ADMITTING PHYSICIAN: Shaune Pollack, MD  DATE OF DISCHARGE: 03/14/2018  PRIMARY CARE PHYSICIAN: Patient, No Pcp Per   ADMISSION DIAGNOSIS:  Shortness of breath [R06.02] Cholecystitis [K81.9] DISCHARGE DIAGNOSIS:  Active Problems:   Cholecystitis   Shortness of breath  SECONDARY DIAGNOSIS:   Past Medical History:  Diagnosis Date  . COPD (chronic obstructive pulmonary disease) (HCC)   . Hypertension    HOSPITAL COURSE:  77 year old male with no significant past medical history other than COPD and smoking presents to hospital secondary to chest pain and abdominal pain and noted to have acute cholecystitis.  1.  Acute cholecystitis-appreciate surgical input.  Patient refused invasive surgery at this time. -Status post IR cholecystostomy placement on 03/12/18-fluid sent for cultures -Change antibiotics to Zosyn for anaerobic coverage as well.  Cholecystostomy tube initially drained purulent material. Change to augmentin for 10 days. -Follow-up with surgery as outpatient in 1 week to 10 days for outpatient laparoscopic cholecystectomy. -Patient tolerating a regular diet today. - discontinued IV fluids.  2.  Hypertension-not on any medications at home.  Started on Norvasc for now and monitor -Much improved blood pressure  3.  UTI-follow-up cultures: NO GROWTH , on Zosyn, change to augmentin.  4.  COPD-stable.  Continue inhalers.  5.  DVT prophylaxis-Lovenox  6.  Tobacco use disorder-on nicotine patch  DISCHARGE CONDITIONS:  Stable, discharge to home today. CONSULTS OBTAINED:  Treatment Team:  Lattie Haw, MD DRUG ALLERGIES:  No Known Allergies DISCHARGE MEDICATIONS:   Allergies as of 03/14/2018   No Known Allergies     Medication List    STOP taking these medications   doxycycline 100 MG  tablet Commonly known as:  VIBRA-TABS   predniSONE 5 MG tablet Commonly known as:  DELTASONE     TAKE these medications   albuterol 108 (90 Base) MCG/ACT inhaler Commonly known as:  PROVENTIL HFA;VENTOLIN HFA Inhale 4-6 puffs by mouth every 4 hours as needed for wheezing, cough, and/or shortness of breath   amLODipine 5 MG tablet Commonly known as:  NORVASC Take 1 tablet (5 mg total) by mouth daily. Start taking on:  03/15/2018   amoxicillin-clavulanate 875-125 MG tablet Commonly known as:  AUGMENTIN Take 1 tablet by mouth every 12 (twelve) hours.   tiotropium 18 MCG inhalation capsule Commonly known as:  SPIRIVA Place 1 capsule (18 mcg total) into inhaler and inhale daily.        DISCHARGE INSTRUCTIONS:  See AVS.  If you experience worsening of your admission symptoms, develop shortness of breath, life threatening emergency, suicidal or homicidal thoughts you must seek medical attention immediately by calling 911 or calling your MD immediately  if symptoms less severe.  You Must read complete instructions/literature along with all the possible adverse reactions/side effects for all the Medicines you take and that have been prescribed to you. Take any new Medicines after you have completely understood and accpet all the possible adverse reactions/side effects.   Please note  You were cared for by a hospitalist during your hospital stay. If you have any questions about your discharge medications or the care you received while you were in the hospital after you are discharged, you can call the unit and asked to speak with the hospitalist on call if the hospitalist that took care of you is  not available. Once you are discharged, your primary care physician will handle any further medical issues. Please note that NO REFILLS for any discharge medications will be authorized once you are discharged, as it is imperative that you return to your primary care physician (or establish a  relationship with a primary care physician if you do not have one) for your aftercare needs so that they can reassess your need for medications and monitor your lab values.    On the day of Discharge:  VITAL SIGNS:  Blood pressure 131/78, pulse 67, temperature 98.5 F (36.9 C), temperature source Oral, resp. rate 18, height 5\' 8"  (1.727 m), weight 149 lb 9.6 oz (67.9 kg), SpO2 97 %. PHYSICAL EXAMINATION:  GENERAL:  77 y.o.-year-old patient lying in the bed with no acute distress.  EYES: Pupils equal, round, reactive to light and accommodation. No scleral icterus. Extraocular muscles intact.  HEENT: Head atraumatic, normocephalic. Oropharynx and nasopharynx clear.  NECK:  Supple, no jugular venous distention. No thyroid enlargement, no tenderness.  LUNGS: Normal breath sounds bilaterally, no wheezing, rales,rhonchi or crepitation. No use of accessory muscles of respiration.  CARDIOVASCULAR: S1, S2 normal. No murmurs, rubs, or gallops.  ABDOMEN: Soft, non-tender, non-distended. Bowel sounds present. No organomegaly or mass.  EXTREMITIES: No pedal edema, cyanosis, or clubbing.  NEUROLOGIC: Cranial nerves II through XII are intact. Muscle strength 5/5 in all extremities. Sensation intact. Gait not checked.  PSYCHIATRIC: The patient is alert and oriented x 3.  SKIN: No obvious rash, lesion, or ulcer.  DATA REVIEW:   CBC Recent Labs  Lab 03/14/18 0422  WBC 8.0  HGB 12.3*  HCT 36.1*  PLT 281    Chemistries  Recent Labs  Lab 03/13/18 0354 03/14/18 0422  NA 141 141  K 4.5 3.9  CL 107 108  CO2 28 27  GLUCOSE 117* 106*  BUN 21 19  CREATININE 1.14 1.18  CALCIUM 8.9 8.9  AST 24  --   ALT 17  --   ALKPHOS 93  --   BILITOT 0.5  --      Microbiology Results  Results for orders placed or performed during the hospital encounter of 03/11/18  Urine Culture     Status: None   Collection Time: 03/12/18  7:49 AM  Result Value Ref Range Status   Specimen Description   Final     URINE, RANDOM Performed at [facility], [address], [place], [place] [address]    Special Requests   Final    NONE Performed at [facility], [address], [place], [place] [address]    Culture   Final    NO GROWTH Performed at [facility], [address], [place], [place] [address]    Report Status 03/14/2018 FINAL  Final  Body fluid culture     Status: None (Preliminary result)   Collection Time: 03/12/18 12:20 PM  Result Value Ref Range Status   Specimen Description   Final    BILE Performed at [facility], [address] [place], [place] [address]    Special Requests   Final    Normal Performed at [facility], [address] [place], [place] [address]    Gram Stain   Final    FEW WBC PRESENT,BOTH PMN AND MONONUCLEAR FEW GRAM VARIABLE ROD Performed at [facility], [address] [place], [place] [address]    Culture ABUNDANT ESCHERICHIA COLI  Final   Report Status PENDING  Incomplete    RADIOLOGY:  No results found.   Management plans discussed with the patient, family and they are in agreement.  CODE STATUS: Full Code   TOTAL TIME TAKING CARE OF THIS PATIENT: 32 minutes.    Shaune PollackQing Kaelie Henigan M.D on 03/14/2018 at 10:54 AM  Between 7am to 6pm - Pager - [phone]  After 6pm go to [email or web address] - Social research officer, governmentpassword [email or web address]  [facility] [place] [facility]  Office  [phone]  CC: Primary care physician; Patient, No Pcp Per   Note: This dictation was prepared with Dragon dictation along with smaller phrase technology. Any transcriptional errors that result from this process are unintentional.

## 2018-03-14 NOTE — Progress Notes (Signed)
Discharge instructions reviewed with patient, wife and son using teachback including drain flushing and dressing changes, recording output as well as new medications and followup appointments.  Understanding was verbalized and all questions were answered.  Patient discharged home ambulatory in stable condition.

## 2018-03-14 NOTE — Care Management (Signed)
Patient was discharged home today.  Patient did not PCP.  Unit secretary made new PCP appointment with Fayrene HelperBOSWELL, CHELSA H, NP  On 03/20/18

## 2018-03-15 LAB — BODY FLUID CULTURE: Special Requests: NORMAL

## 2018-03-20 ENCOUNTER — Other Ambulatory Visit
Admission: RE | Admit: 2018-03-20 | Discharge: 2018-03-20 | Disposition: A | Discharge status: Home / Self Care | Payer: Self-pay | Source: Ambulatory Visit | Attending: Surgery | Admitting: Surgery

## 2018-03-20 ENCOUNTER — Ambulatory Visit (INDEPENDENT_AMBULATORY_CARE_PROVIDER_SITE_OTHER): Payer: Self-pay | Admitting: Surgery

## 2018-03-20 ENCOUNTER — Encounter: Payer: Self-pay | Admitting: Surgery

## 2018-03-20 VITALS — BP 175/90 | HR 67 | Temp 97.8°F | Wt 152.0 lb

## 2018-03-20 DIAGNOSIS — K8001 Calculus of gallbladder with acute cholecystitis with obstruction: Secondary | ICD-10-CM | POA: Insufficient documentation

## 2018-03-20 LAB — COMPREHENSIVE METABOLIC PANEL
ALT: 30 U/L (ref 0–44)
ANION GAP: 7 (ref 5–15)
AST: 22 U/L (ref 15–41)
Albumin: 4 g/dL (ref 3.5–5.0)
Alkaline Phosphatase: 95 U/L (ref 38–126)
BILIRUBIN TOTAL: 0.6 mg/dL (ref 0.3–1.2)
BUN: 18 mg/dL (ref 8–23)
CHLORIDE: 105 mmol/L (ref 98–111)
CO2: 27 mmol/L (ref 22–32)
Calcium: 9.6 mg/dL (ref 8.9–10.3)
Creatinine, Ser: 0.98 mg/dL (ref 0.61–1.24)
GFR calc Af Amer: >60 mL/min (ref >60)
GFR calc non Af Amer: >60 mL/min (ref >60)
Glucose, Bld: 117 mg/dL — ABNORMAL HIGH (ref 70–99)
POTASSIUM: 4.6 mmol/L (ref 3.5–5.1)
Sodium: 139 mmol/L (ref 135–145)
TOTAL PROTEIN: 7.3 g/dL (ref 6.5–8.1)

## 2018-03-20 LAB — CBC
HEMATOCRIT: 41 % (ref 40.0–52.0)
HEMOGLOBIN: 14 g/dL (ref 13.0–18.0)
MCH: 30.2 pg (ref 26.0–34.0)
MCHC: 34.1 g/dL (ref 32.0–36.0)
MCV: 88.5 fL (ref 80.0–100.0)
Platelets: 386 10*3/uL (ref 150–440)
RBC: 4.63 MIL/uL (ref 4.40–5.90)
RDW: 14.7 % — AB (ref 11.5–14.5)
WBC: 6.7 10*3/uL (ref 3.8–10.6)

## 2018-03-20 NOTE — Progress Notes (Signed)
Outpatient Surgical Follow Up  03/20/2018  Ross InfanteRalph Piggee is an 77 y.o. male.   CC: Cholecystostomy tube management  HPI: This patient with severe hypertension who was admitted to the hospital by the medical service where acute cholecystitis was identified.  A cholecystostomy tube was placed partly at the patient's request as he was not ready and did not want to have surgery at that time.  His blood pressure was severely elevated.  Patient describes right upper quadrant pain and associates it with tube itself but there is scant drainage from the tube.  Past Medical History:  Diagnosis Date  . COPD (chronic obstructive pulmonary disease) (HCC)   . Hypertension     Past Surgical History:  Procedure Laterality Date  . IR PERC CHOLECYSTOSTOMY  03/12/2018    Family History  Problem Relation Age of Onset  . Heart disease Mother   . Heart disease Father   . Non-Hodgkin's lymphoma Sister   . Non-Hodgkin's lymphoma Brother     Social History:  reports that he has quit smoking. His smoking use included cigarettes. He has never used smokeless tobacco. He reports that he does not drink alcohol or use drugs.  Allergies:  Allergies  Allergen Reactions  . Demerol  [Meperidine Hcl]     Medications reviewed.   Review of Systems:   Review of Systems  Constitutional: Negative.   HENT: Negative.   Eyes: Negative.   Respiratory: Negative.   Cardiovascular: Negative.   Gastrointestinal: Positive for abdominal pain and nausea.  Genitourinary: Negative.   Musculoskeletal: Negative.   Skin: Negative.   Neurological: Negative.   Endo/Heme/Allergies: Negative.   Psychiatric/Behavioral: Negative.      Physical Exam:  There were no vitals taken for this visit.  Physical Exam  Constitutional: He is oriented to person, place, and time. He appears well-developed and well-nourished. No distress.  HENT:  Head: Normocephalic and atraumatic.  Eyes: Pupils are equal, round, and reactive to  light. EOM are normal. Right eye exhibits no discharge. Left eye exhibits no discharge. No scleral icterus.  Neck: Normal range of motion. Neck supple.  Cardiovascular: Normal rate, regular rhythm and normal heart sounds.  Pulmonary/Chest: Effort normal and breath sounds normal. No stridor. No respiratory distress.  Abdominal: Soft. Bowel sounds are normal. He exhibits no distension and no mass. There is no tenderness. There is no rebound and no guarding.  Cholecystostomy tube in place.  Drainage is serous with slight bile tinge.  Musculoskeletal: Normal range of motion. He exhibits no edema or deformity.  Lymphadenopathy:    He has no cervical adenopathy.  Neurological: He is alert and oriented to person, place, and time.  Skin: Skin is warm and dry. He is not diaphoretic.  Vitals reviewed.     No results found for this or any previous visit (from the past 48 hour(s)). No results found.  Assessment/Plan:  Patient seems to be fairly uninformed about the cholecystostomy tube.  I reminded him of all the things that we spoke about in the hospital concerning the options of surgery and the fact that the cholecystostomy tube now needs to stay in place for several weeks prior to performing a cholecystectomy.  He is a good candidate for cholecystectomy in a few weeks.  His blood pressure is better controlled and I reminded him that he needs to stay on the amlodipine.  Will finish out his current antibiotics. We will recheck labs today as well.  See him back in 2 to 3 weeks. Richard E  Excell Seltzerooper, MD, FACS

## 2018-03-20 NOTE — Patient Instructions (Signed)
[facility] and [facility] your [facility] drawn.   We will see you back in 3 weeks to make sure that you are doing better and possibly discuss surgery date.

## 2018-03-20 NOTE — Addendum Note (Signed)
Addended by: Adela PortsBONICHE, Maysen Bonsignore on: 03/20/2018 09:53 AM   Modules accepted: Orders

## 2018-03-27 ENCOUNTER — Telehealth: Payer: Self-pay | Admitting: Surgery

## 2018-03-27 NOTE — Telephone Encounter (Signed)
Patient's wife Liborio NixonJanice called & wanted to know if the patient could take pepsid complete. He is a patient of Dr Cooper's.

## 2018-03-27 NOTE — Telephone Encounter (Signed)
I spoke to Ross Montgomery an she states she is not able to give permission nor was Dr Excell Seltzerooper. They would need to speak with his primary care about the medication.

## 2018-04-04 ENCOUNTER — Telehealth: Payer: Self-pay

## 2018-04-04 NOTE — Telephone Encounter (Signed)
Patient's wife called with concerns about patient's drain. She says he has some drainage around where it is going in. Reports some red/brown drainage on the pad. Denies any chills, fever, redness, or warmth to tough. Advised that this is normal and the drain will not come out on its own. Instructed she may change the dressing as needed or daily. Will follow up on 04/11/18 as scheduled.

## 2018-04-04 NOTE — Telephone Encounter (Signed)
Tried reaching patient via phone-unable to leave message due to mail box not set up.

## 2018-04-11 ENCOUNTER — Ambulatory Visit (INDEPENDENT_AMBULATORY_CARE_PROVIDER_SITE_OTHER): Payer: Self-pay | Admitting: Surgery

## 2018-04-11 ENCOUNTER — Ambulatory Visit
Admission: RE | Admit: 2018-04-11 | Discharge: 2018-04-11 | Disposition: A | Discharge status: Home / Self Care | Payer: Self-pay | Source: Ambulatory Visit | Attending: Surgery | Admitting: Surgery

## 2018-04-11 ENCOUNTER — Encounter: Payer: Self-pay | Admitting: Surgery

## 2018-04-11 VITALS — BP 128/82 | HR 71 | Resp 12 | Ht 68.0 in | Wt 152.2 lb

## 2018-04-11 DIAGNOSIS — K820 Obstruction of gallbladder: Secondary | ICD-10-CM

## 2018-04-11 DIAGNOSIS — K5730 Diverticulosis of large intestine without perforation or abscess without bleeding: Secondary | ICD-10-CM | POA: Insufficient documentation

## 2018-04-11 DIAGNOSIS — I700 Atherosclerosis of aorta: Secondary | ICD-10-CM | POA: Insufficient documentation

## 2018-04-11 DIAGNOSIS — K8001 Calculus of gallbladder with acute cholecystitis with obstruction: Secondary | ICD-10-CM

## 2018-04-11 DIAGNOSIS — M5136 Other intervertebral disc degeneration, lumbar region: Secondary | ICD-10-CM | POA: Insufficient documentation

## 2018-04-11 DIAGNOSIS — K8010 Calculus of gallbladder with chronic cholecystitis without obstruction: Secondary | ICD-10-CM | POA: Insufficient documentation

## 2018-04-11 DIAGNOSIS — N200 Calculus of kidney: Secondary | ICD-10-CM | POA: Insufficient documentation

## 2018-04-11 MED ORDER — IOPAMIDOL (ISOVUE-300) INJECTION 61%
100.0000 mL | Freq: Once | INTRAVENOUS | Status: AC | PRN
  Administered 2018-04-11: 100 mL via INTRAVENOUS

## 2018-04-11 NOTE — Progress Notes (Signed)
Outpatient Surgical Follow Up  04/11/2018  Ross Montgomery is an 77 y.o. male.   CC: Cholecystostomy tube in place  HPI: Is a patient with a history of acute cholecystitis who opted for cholecystostomy tube placement while in the hospital.  He is here for follow-up.  He also has considerable COPD and severe hypertension.  The first thing the patient told me is that he does not want to have surgery under any circumstance.  He denies fevers or chills and feels well with no pain.  Past Medical History:  Diagnosis Date  . COPD (chronic obstructive pulmonary disease) (HCC)   . Hypertension     Past Surgical History:  Procedure Laterality Date  . IR PERC CHOLECYSTOSTOMY  03/12/2018    Family History  Problem Relation Age of Onset  . Heart disease Mother   . Heart disease Father   . Non-Hodgkin's lymphoma Sister   . Non-Hodgkin's lymphoma Brother     Social History:  reports that he has quit smoking. His smoking use included cigarettes. He has never used smokeless tobacco. He reports that he does not drink alcohol or use drugs.  Allergies:  Allergies  Allergen Reactions  . Demerol  [Meperidine Hcl]     Medications reviewed.   Review of Systems:   Review of Systems  Constitutional: Negative.   HENT: Negative.   Eyes: Negative.   Respiratory: Negative.   Cardiovascular: Negative.   Gastrointestinal: Negative.   Genitourinary: Negative.   Musculoskeletal: Negative.   Skin: Negative.   Neurological: Negative.   Endo/Heme/Allergies: Negative.   Psychiatric/Behavioral: Negative.      Physical Exam:  There were no vitals taken for this visit.  Physical Exam  Constitutional: He is oriented to person, place, and time. He appears well-developed and well-nourished. No distress.  HENT:  Head: Normocephalic and atraumatic.  Eyes: Pupils are equal, round, and reactive to light. EOM are normal. Right eye exhibits no discharge. Left eye exhibits no discharge. No scleral  icterus.  Neck: Normal range of motion. Neck supple.  Cardiovascular: Normal rate, regular rhythm and normal heart sounds.  Pulmonary/Chest: Effort normal and breath sounds normal. No stridor. No respiratory distress.  Abdominal: Soft. He exhibits no distension. There is no tenderness.  Right lateral cholecystostomy tube in place Nontender abdomen.  No bile in drain.  Catheter drainage bag is essentially dry.  Musculoskeletal: Normal range of motion. He exhibits no edema or deformity.  Neurological: He is alert and oriented to person, place, and time.  Skin: Skin is warm and dry. He is not diaphoretic. No erythema.  Psychiatric: He has a normal mood and affect. Judgment normal.  Vitals reviewed.     No results found for this or any previous visit (from the past 48 hour(s)). No results found.  Assessment/Plan:  This patient with a history of acute cholecystitis his blood pressure is better controlled and his lungs are not showing considerable wheezing at this time from his COPD.  The time to perform surgery would be in the next 2 to 3 weeks because of the increased risk of recurrent acute cholecystitis.  However the patient is adamant about not performing surgery and would like to have the drain removed. I will obtain a CT scan to reassess his cholecystitis and likely remove his drain next week.  Again surgical intervention was offered in the form of a laparoscopic cholecystectomy but he is adamant against any sort of surgery.  Lattie Haw, MD, FACS

## 2018-04-11 NOTE — Patient Instructions (Addendum)
Patient is to follow up after Ct Scan of the abdomen.

## 2018-04-20 ENCOUNTER — Ambulatory Visit: Payer: Medicare Other | Admitting: Surgery

## 2018-04-27 ENCOUNTER — Ambulatory Visit: Payer: Medicare Other | Admitting: Surgery

## 2018-05-01 ENCOUNTER — Ambulatory Visit (INDEPENDENT_AMBULATORY_CARE_PROVIDER_SITE_OTHER): Payer: Medicare Other | Admitting: Surgery

## 2018-05-01 ENCOUNTER — Encounter: Payer: Self-pay | Admitting: Surgery

## 2018-05-01 VITALS — BP 128/78 | HR 68 | Temp 97.9°F | Ht 68.0 in | Wt 151.0 lb

## 2018-05-01 DIAGNOSIS — K8001 Calculus of gallbladder with acute cholecystitis with obstruction: Secondary | ICD-10-CM

## 2018-05-01 NOTE — Progress Notes (Signed)
Outpatient Surgical Follow Up  05/01/2018  Ross Montgomery is an 77 y.o. male.   CC: Cholecystostomy tube  HPI: This patient had a percutaneous cholecystostomy tube placed for acute cholecystitis and multiple medical problems including septic shock at the time of his admission.  CT scan was performed 2 weeks ago and he did not show for his appointment last week.  He continues to have mildly thickened gallbladder.  Patient has no complaints today except he wants the tube removed.  He is adamant against having his gallbladder removed.  He rejects any thought of surgical intervention at this time.  Attributes this to his age and comorbidities.  He is reminded of how sick he was when he came into the ER initially.  Reminded him that he could easily revert to that severely ill condition (shock) if the tube is removed to soon or he opts for not having his gallbladder removed.  He remains essentially adamant against surgical intervention.  Wife is present.  Past Medical History:  Diagnosis Date  . COPD (chronic obstructive pulmonary disease) (HCC)   . Hypertension     Past Surgical History:  Procedure Laterality Date  . IR PERC CHOLECYSTOSTOMY  03/12/2018    Family History  Problem Relation Age of Onset  . Heart disease Mother   . Heart disease Father   . Non-Hodgkin's lymphoma Sister   . Non-Hodgkin's lymphoma Brother     Social History:  reports that he has quit smoking. His smoking use included cigarettes. He has never used smokeless tobacco. He reports that he does not drink alcohol or use drugs.  Allergies:  Allergies  Allergen Reactions  . Demerol  [Meperidine Hcl]     Medications reviewed.   Review of Systems:   Review of Systems  Constitutional: Negative for chills and fever.  HENT: Negative.   Eyes: Negative.   Respiratory: Negative.   Cardiovascular: Negative.   Gastrointestinal: Negative for abdominal pain, heartburn, nausea and vomiting.  Genitourinary: Negative.    Musculoskeletal: Negative.   Skin: Negative.   Neurological: Negative.   Endo/Heme/Allergies: Negative.   Psychiatric/Behavioral: Negative.      Physical Exam:  There were no vitals taken for this visit.  Physical Exam  Constitutional: He is oriented to person, place, and time. He appears well-developed and well-nourished. No distress.  HENT:  Head: Normocephalic and atraumatic.  Eyes: Right eye exhibits no discharge. Left eye exhibits no discharge. No scleral icterus.  Pulmonary/Chest: Effort normal. No respiratory distress.  Abdominal: Soft. He exhibits no distension. There is no tenderness.  Right upper quadrant cholecystostomy tube in place.  No purulence clear fluid in bile bag with minimal bile tinge.  Musculoskeletal: He exhibits no edema or deformity.  Neurological: He is oriented to person, place, and time.  Skin: He is not diaphoretic.  Vitals reviewed.     No results found for this or any previous visit (from the past 48 hour(s)). No results found.  Assessment/Plan:  Cholecystostomy tube in place for acute cholecystitis.  Patient continues to have some inflammation in the area of the gallbladder with gallbladder thickening.  See above.  I discussed again with he and his wife the rationale for offering surgery and how that would be the best means of dealing with this cholecystostomy tube.  He is in good condition at this point in the time for surgery would be now as opposed to when he is ill again.  He wants the tube removed but I recommended that it stay in  at least for another few weeks and then a cholangiogram could be performed.  He is adamant against any surgical intervention.  He will follow-up in a few weeks.  I discussed my leaving [place] [facility] with him and his wife.  Lattie Haw, MD, FACS

## 2018-05-16 ENCOUNTER — Ambulatory Visit (INDEPENDENT_AMBULATORY_CARE_PROVIDER_SITE_OTHER): Payer: Self-pay

## 2018-05-16 ENCOUNTER — Telehealth: Payer: Self-pay | Admitting: Surgery

## 2018-05-16 VITALS — BP 126/78 | HR 80 | Temp 97.8°F | Ht 68.0 in | Wt 150.0 lb

## 2018-05-16 DIAGNOSIS — K8001 Calculus of gallbladder with acute cholecystitis with obstruction: Secondary | ICD-10-CM

## 2018-05-16 NOTE — Patient Instructions (Signed)
Please give [place] a call in case you have any questions or concerns.   We will see you back in two weeks.

## 2018-05-16 NOTE — Telephone Encounter (Signed)
Called patient back and he stated that he is worried because the clip that attaches to the cholecystostomy bag can not attach. He stated that he had tried several ways to attach it and no success. I asked patient if he could come in as a nurse visit so we could take a look at it. Patient agreed and stated that he was on his way.

## 2018-05-16 NOTE — Telephone Encounter (Signed)
Patient is calling said his tubing clip came a part and is concerned. Please call patient and advise.

## 2018-05-16 NOTE — Progress Notes (Signed)
Patient came in today because when he called earlier he stated that his cholecystostomy bag clip was not clipping. Therefore, I asked him to come in for me to take a look at it. Patient arrived and noticed that his clip was not clipping. So, I put some tape around it and it seemed to work. I made sure that the clip stayed attached. I told patient to come in in two weeks to see Dr. Aleen Campi since Dr. Excell Seltzer wanted him to be seen then to discuss possible surgery. Patient agreed and had no further questions.

## 2018-05-19 DIAGNOSIS — K4020 Bilateral inguinal hernia, without obstruction or gangrene, not specified as recurrent: Secondary | ICD-10-CM

## 2018-05-19 DIAGNOSIS — I10 Essential (primary) hypertension: Secondary | ICD-10-CM

## 2018-05-19 DIAGNOSIS — T85518A Breakdown (mechanical) of other gastrointestinal prosthetic devices, implants and grafts, initial encounter: Secondary | ICD-10-CM

## 2018-05-19 DIAGNOSIS — K810 Acute cholecystitis: Secondary | ICD-10-CM

## 2018-05-19 DIAGNOSIS — J449 Chronic obstructive pulmonary disease, unspecified: Secondary | ICD-10-CM

## 2018-05-29 ENCOUNTER — Other Ambulatory Visit: Payer: Self-pay

## 2018-05-29 ENCOUNTER — Telehealth: Payer: Self-pay

## 2018-05-29 DIAGNOSIS — K8001 Calculus of gallbladder with acute cholecystitis with obstruction: Secondary | ICD-10-CM

## 2018-05-29 NOTE — Telephone Encounter (Signed)
Appointment with Dr. Aleen Campi was cancelled due to patient not having a cholangiogram prior. Once cholangiogram is scheduled, then we need to schedule patient an appointment to see Dr. Aleen Campi.

## 2018-05-31 ENCOUNTER — Ambulatory Visit: Payer: Medicare Other | Admitting: Surgery

## 2018-05-31 NOTE — Telephone Encounter (Signed)
Called patient again and he did not answer his phone. I was not able to leave him a voicemail. I had mailed patient appointment reminders for his cholangiogram and follow up appointment with Dr. Aleen Campi.

## 2018-06-02 ENCOUNTER — Ambulatory Visit: Admission: RE | Admit: 2018-06-02 | Payer: Medicare Other | Source: Ambulatory Visit

## 2018-06-08 ENCOUNTER — Telehealth: Payer: Self-pay | Admitting: *Deleted

## 2018-06-08 NOTE — Telephone Encounter (Signed)
Called patient several times with not able to leave messages. Patient was suppose to follow up on Friday with Dr.Piscoya to go over Cholangiogram results, which patient did not have. Patient needs to reschedule this before he sees the Doctor

## 2018-06-09 ENCOUNTER — Ambulatory Visit: Payer: Self-pay | Admitting: Surgery

## 2018-08-15 ENCOUNTER — Encounter: Payer: Self-pay | Admitting: *Deleted

## 2019-04-16 IMAGING — US US EXTREM  UP VENOUS*L*
1 series · 13 of 24 positions shown · non-contrast
Comparison: None.

CLINICAL DATA: Swelling.



[Series 1: us extrem up venous*left* · 0.08mm/px · 13 of 44 slices shown]
[im 1/44]
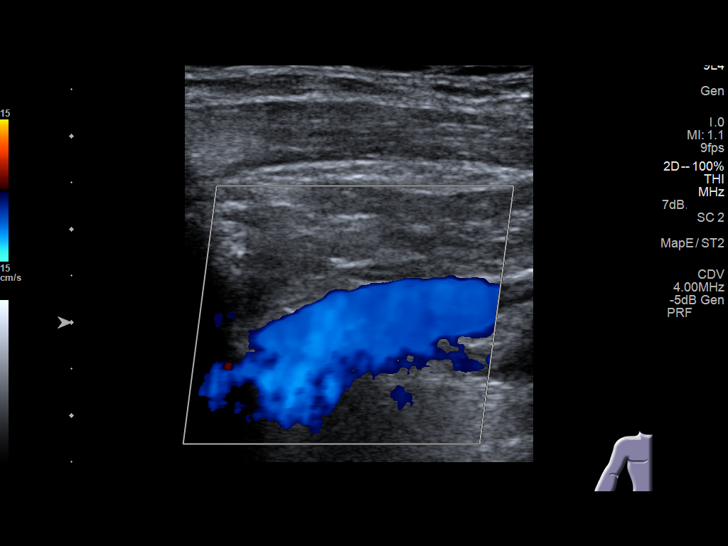
[im 4/44]
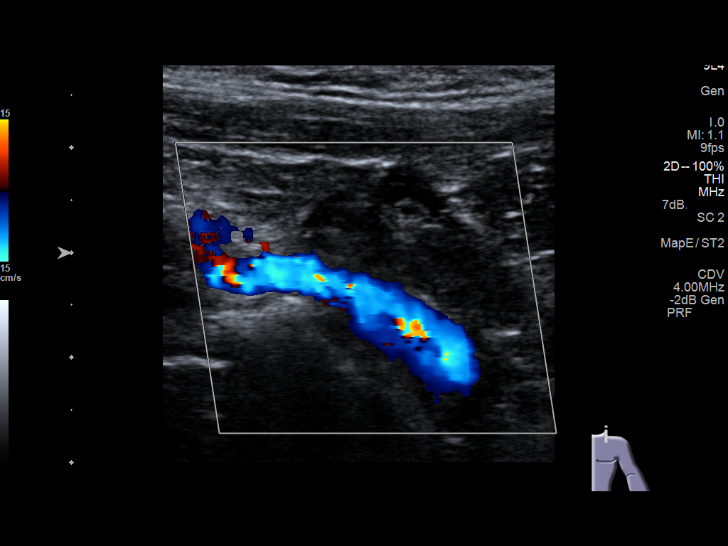
[im 8/44]
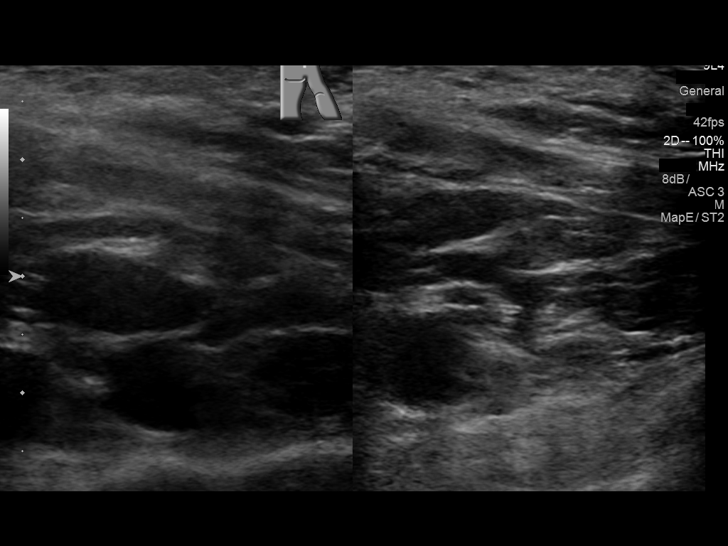
[im 12/44]
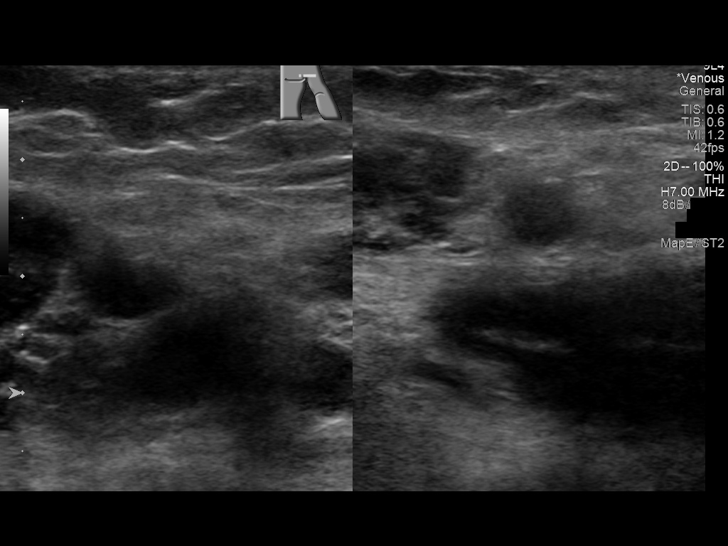
[im 15/44]
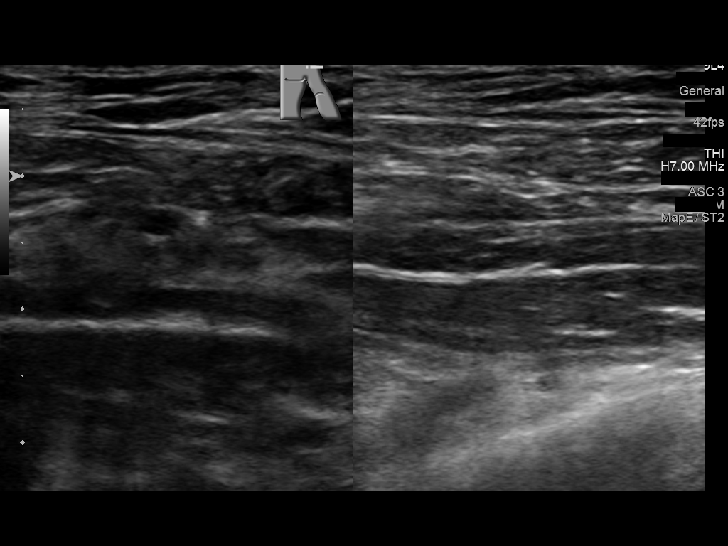
[im 19/44]
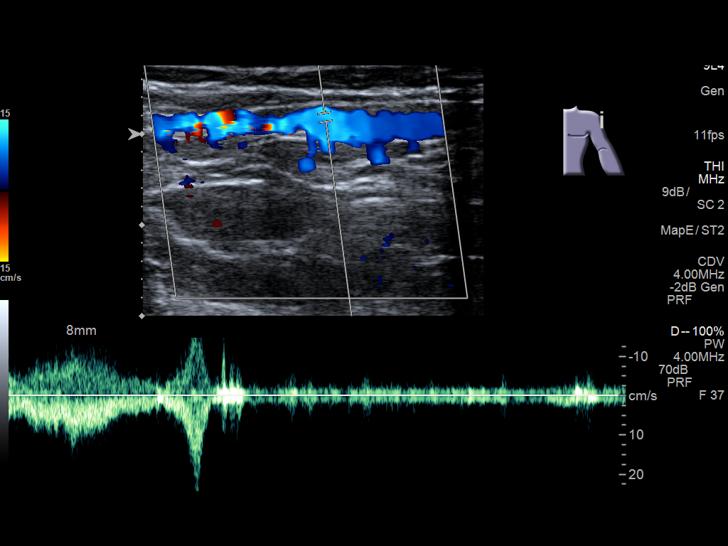
[im 23/44]
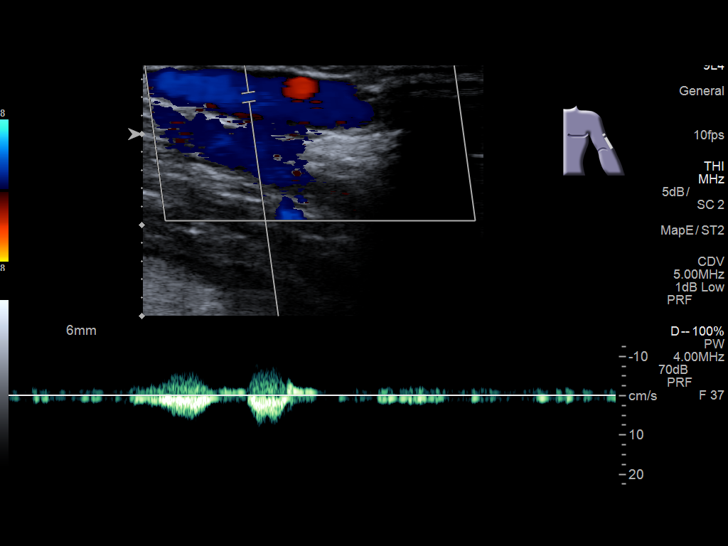
[im 25/44]
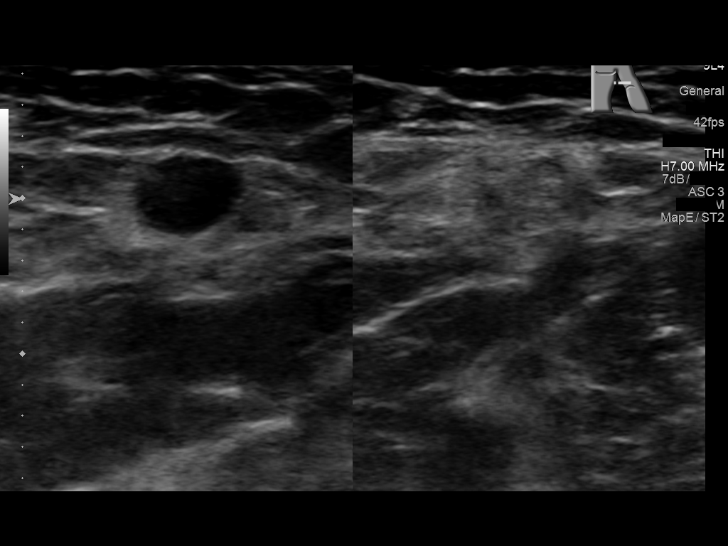
[im 29/44]
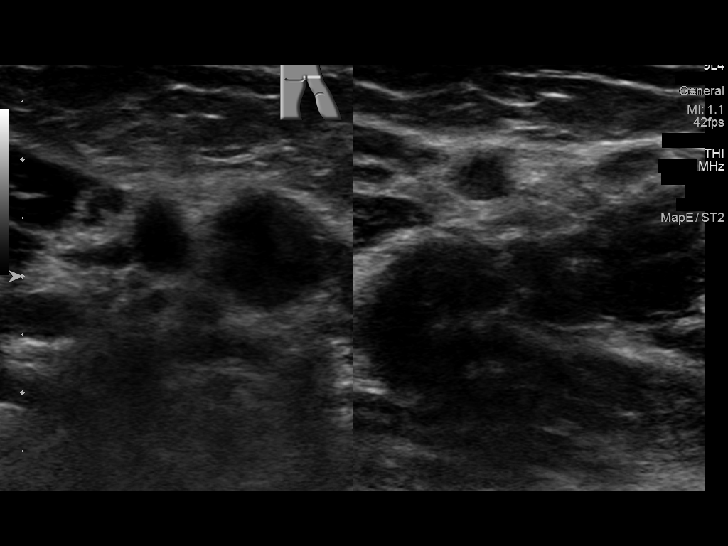
[im 32/44]
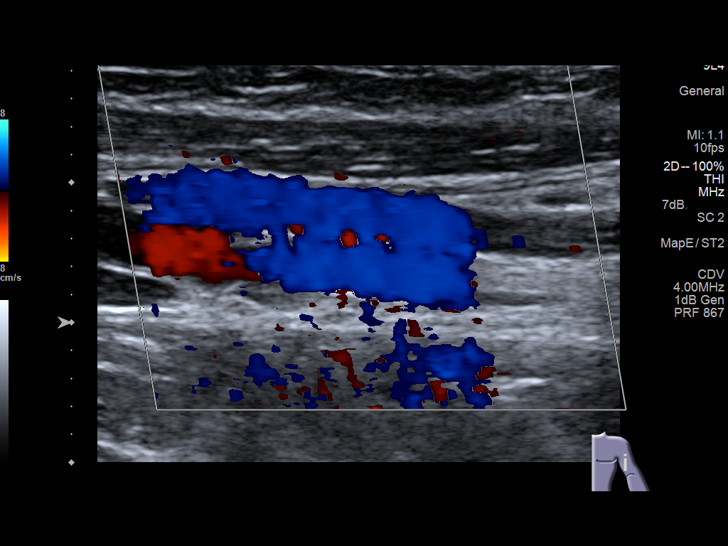
[im 36/44]
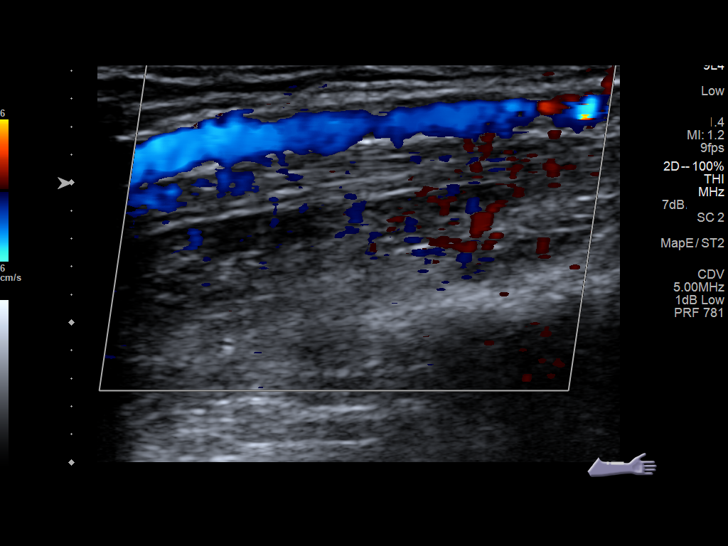
[im 40/44]
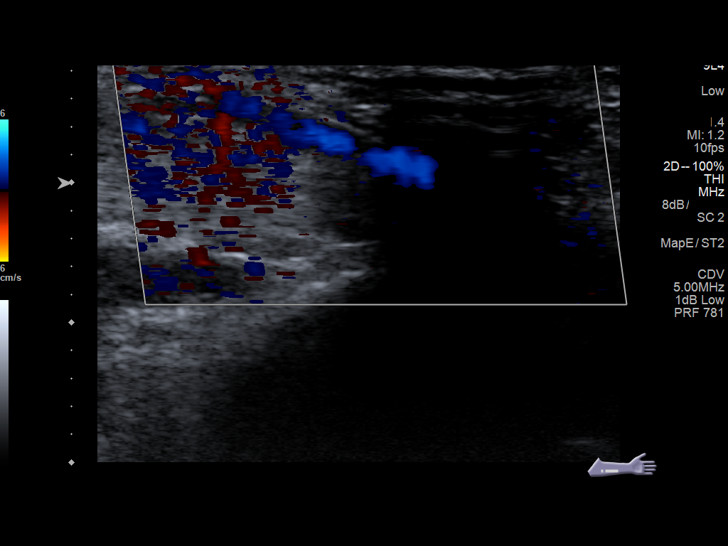
[im 44/44]
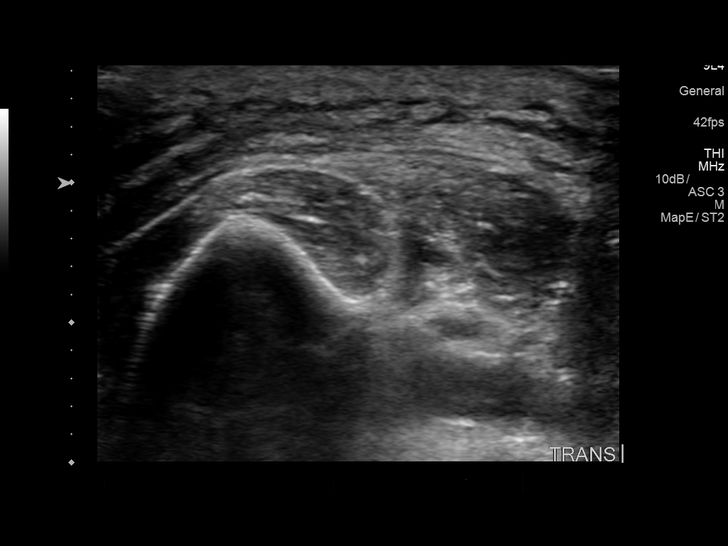

[13 of 24 positions shown; findings below may reference images not displayed]

FINDINGS: Contralateral Subclavian Vein: Respiratory phasicity is normal and
symmetric with the symptomatic side. No evidence of thrombus. Normal
compressibility.

Internal Jugular Vein: No evidence of thrombus. Normal
compressibility, respiratory phasicity and response to augmentation.

Subclavian Vein: No evidence of thrombus. Normal compressibility,
respiratory phasicity and response to augmentation.

Axillary Vein: No evidence of thrombus. Normal compressibility,
respiratory phasicity and response to augmentation.

Cephalic Vein: No evidence of thrombus. Normal compressibility,
respiratory phasicity and response to augmentation.

Basilic Vein: No evidence of thrombus. Normal compressibility,
respiratory phasicity and response to augmentation.

Brachial Veins: No evidence of thrombus. Normal compressibility,
respiratory phasicity and response to augmentation.

Radial Veins: No evidence of thrombus. Normal compressibility,
respiratory phasicity and response to augmentation.

Ulnar Veins: No evidence of thrombus. Normal compressibility,
respiratory phasicity and response to augmentation.

Venous Reflux:  None visualized.

Other Findings:  None visualized.
IMPRESSION: No evidence of DVT within the left upper extremity.
